# Patient Record
Sex: Male | Born: 1985 | Race: Black or African American | Hispanic: No | Marital: Single | Smoking: Current every day smoker
Health system: Southern US, Community
[De-identification: ages and names within clinical notes are randomized; demographics above are authoritative.]

---
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
## 2005-04-14 ENCOUNTER — Emergency Department (HOSPITAL_COMMUNITY): Admission: EM | Admit: 2005-04-14 | Discharge: 2005-04-14 | Payer: Self-pay | Admitting: Emergency Medicine

## 2006-04-08 ENCOUNTER — Emergency Department (HOSPITAL_COMMUNITY): Admission: EM | Admit: 2006-04-08 | Discharge: 2006-04-09 | Payer: Self-pay | Admitting: Emergency Medicine

## 2006-04-25 ENCOUNTER — Emergency Department (HOSPITAL_COMMUNITY): Admission: EM | Admit: 2006-04-25 | Discharge: 2006-04-26 | Payer: Self-pay | Admitting: Emergency Medicine

## 2006-09-15 ENCOUNTER — Emergency Department (HOSPITAL_COMMUNITY): Admission: EM | Admit: 2006-09-15 | Discharge: 2006-09-15 | Payer: Self-pay | Admitting: Emergency Medicine

## 2007-05-02 IMAGING — CR DG CHEST 2V
2 series · 2 of 2 positions shown · non-contrast
Comparison: None.

CLINICAL DATA: Cough with migraine for 2 days.   Smoker. 
 CHEST - 2 VIEW:

[w chest pa]
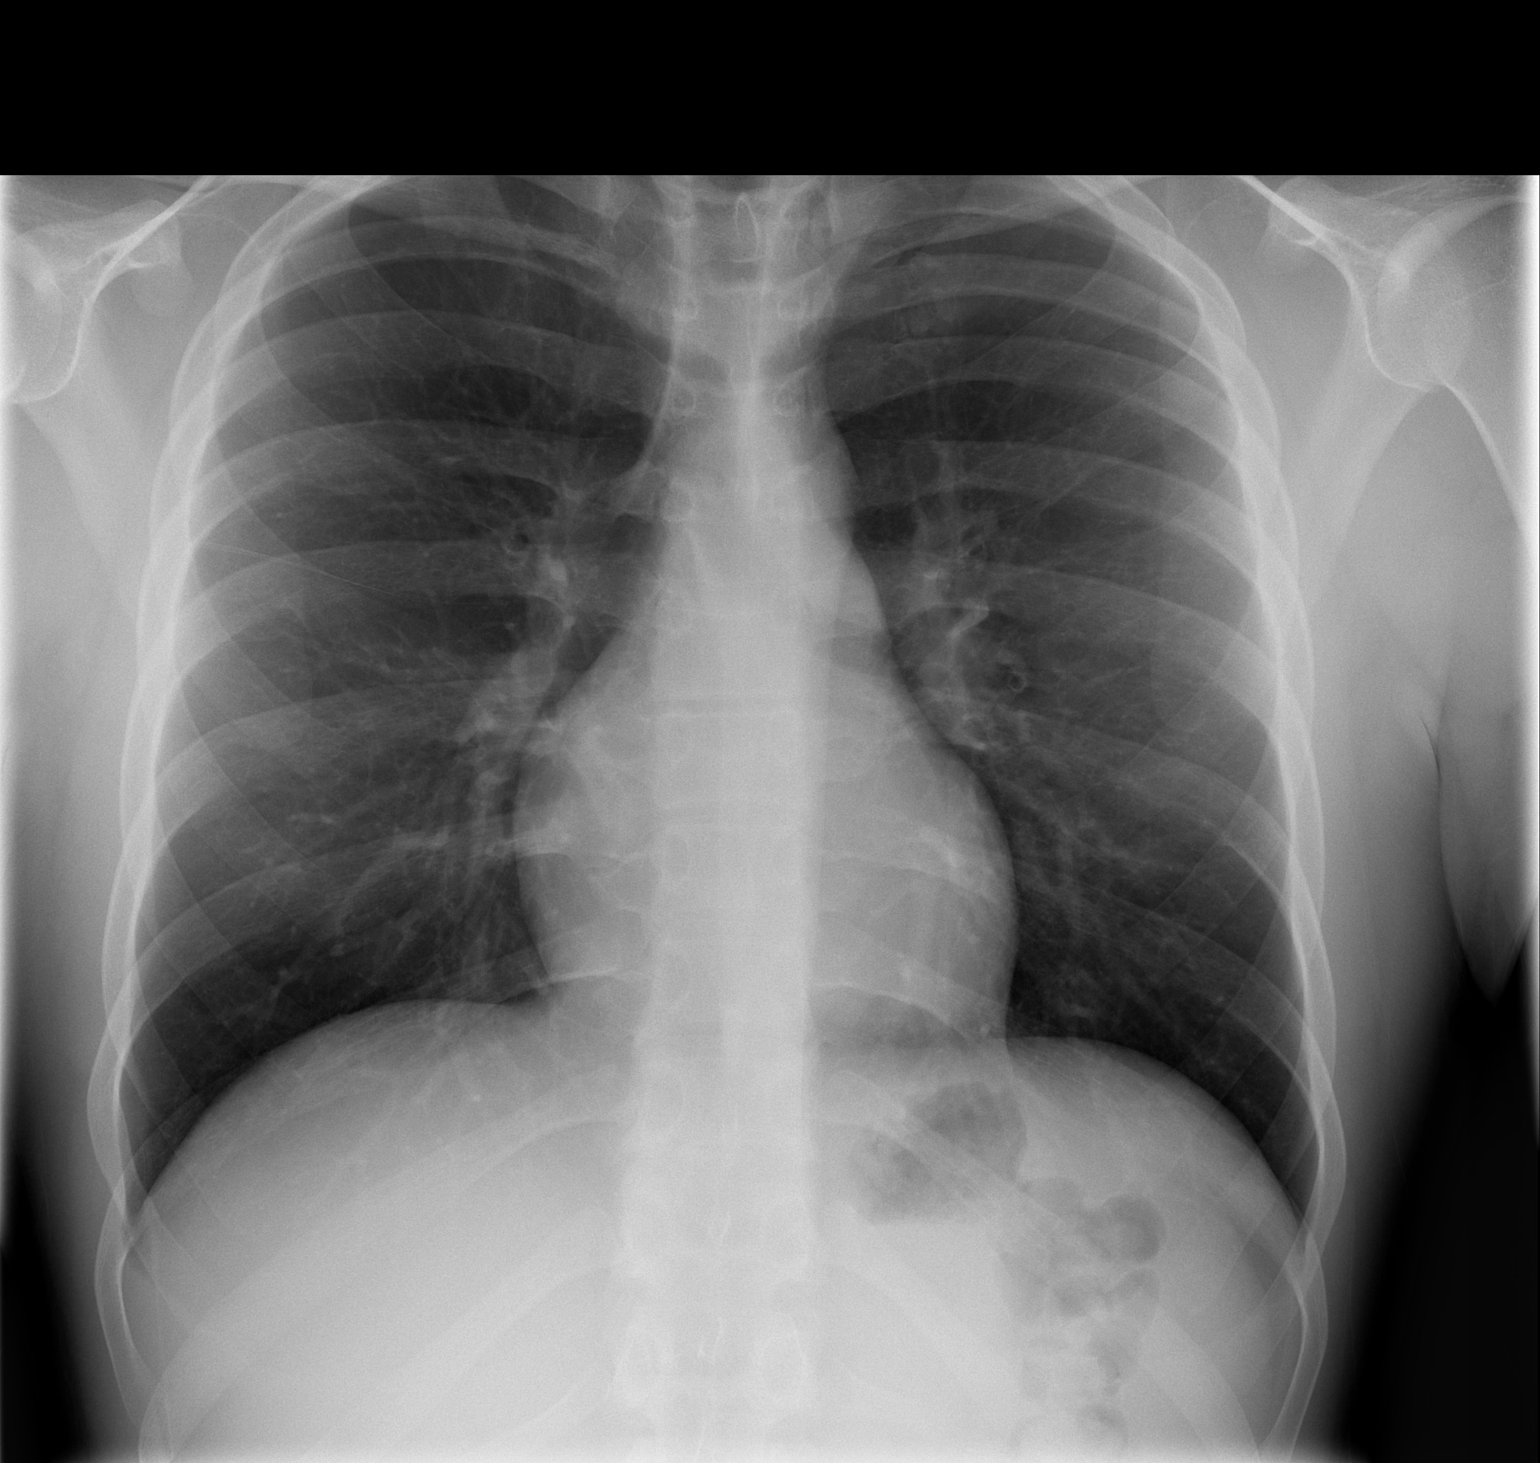

[w chest lat]
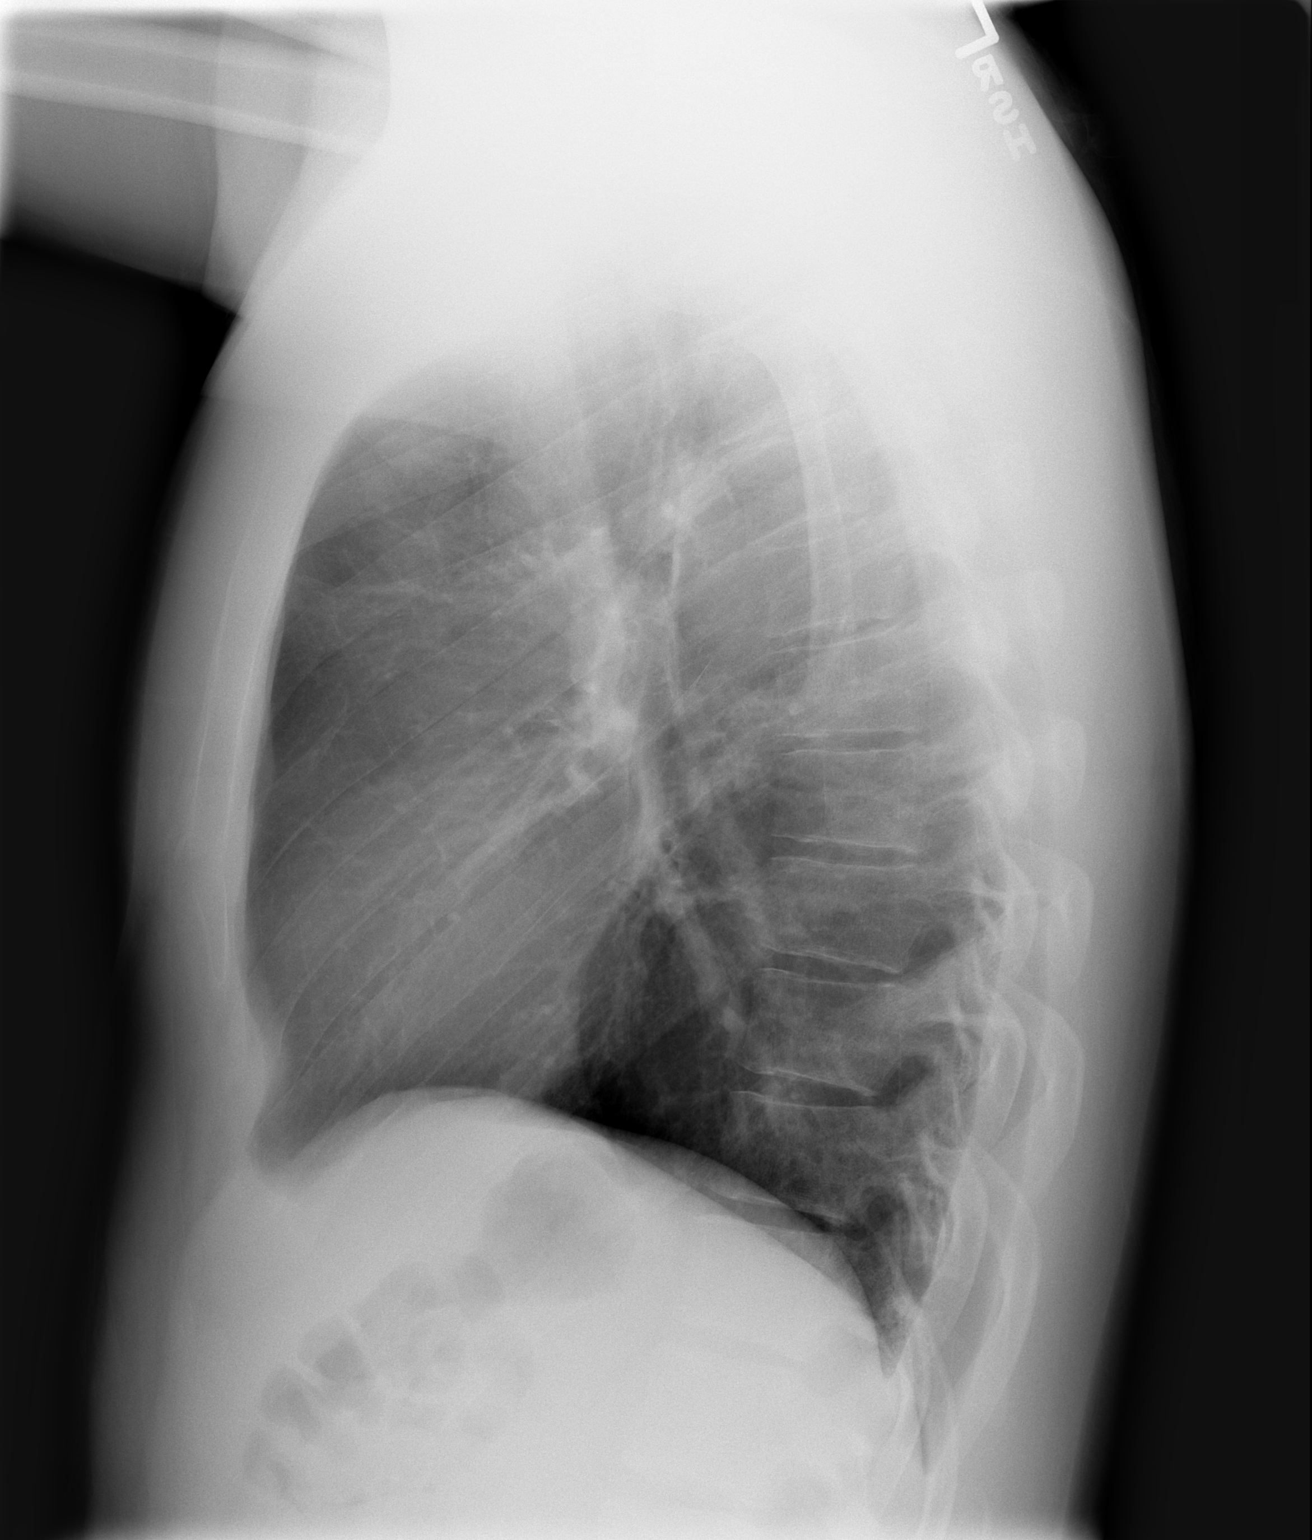

[2 of 2 positions shown; findings below may reference images not displayed]

FINDINGS: Heart size is normal. There is no pleural effusions or pneumothorax.  No focal airspace opacities are identified.
IMPRESSION: Normal chest.

## 2007-07-24 ENCOUNTER — Emergency Department (HOSPITAL_COMMUNITY): Admission: EM | Admit: 2007-07-24 | Discharge: 2007-07-24 | Payer: Self-pay | Admitting: Family Medicine

## 2007-10-14 ENCOUNTER — Emergency Department (HOSPITAL_COMMUNITY): Admission: EM | Admit: 2007-10-14 | Discharge: 2007-10-14 | Payer: Self-pay | Admitting: Family Medicine

## 2008-10-02 IMAGING — CR DG HAND COMPLETE 3+V*R*
3 series · 3 of 3 positions shown · non-contrast
Comparison: none

CLINICAL DATA: Injury to hand.  Pain in the metatarsals.
 RIGHT HAND ? 3 VIEWS:

[x hand pa right]
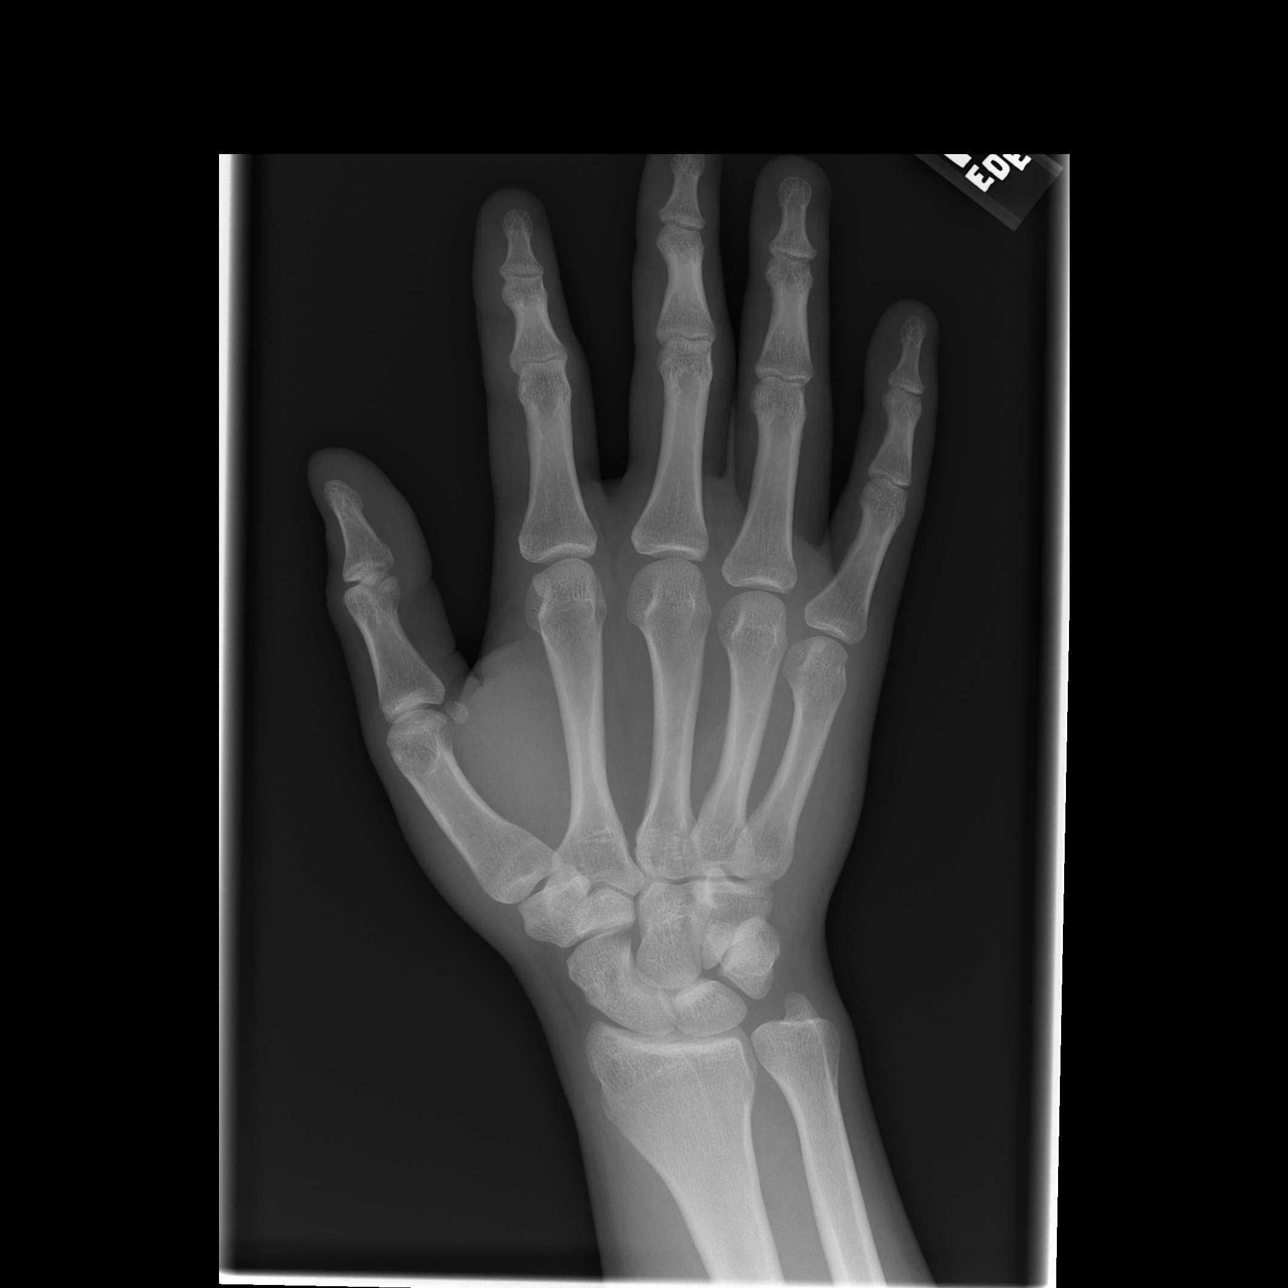

[x hand oblique right]
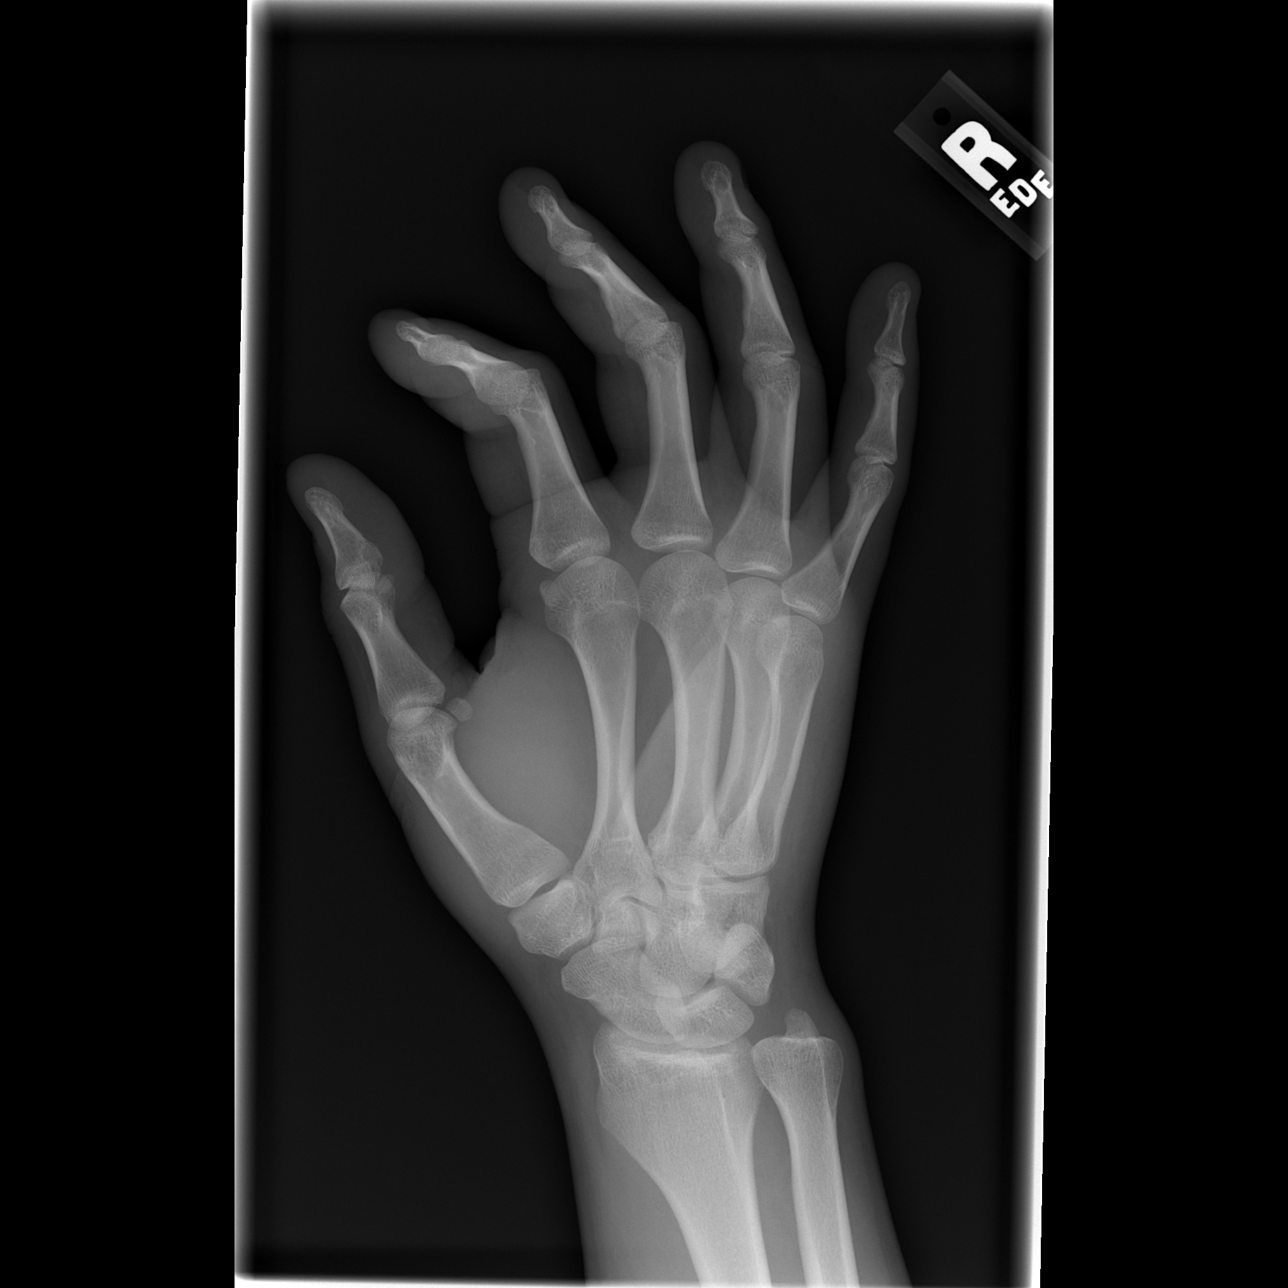

[x hand lat right]
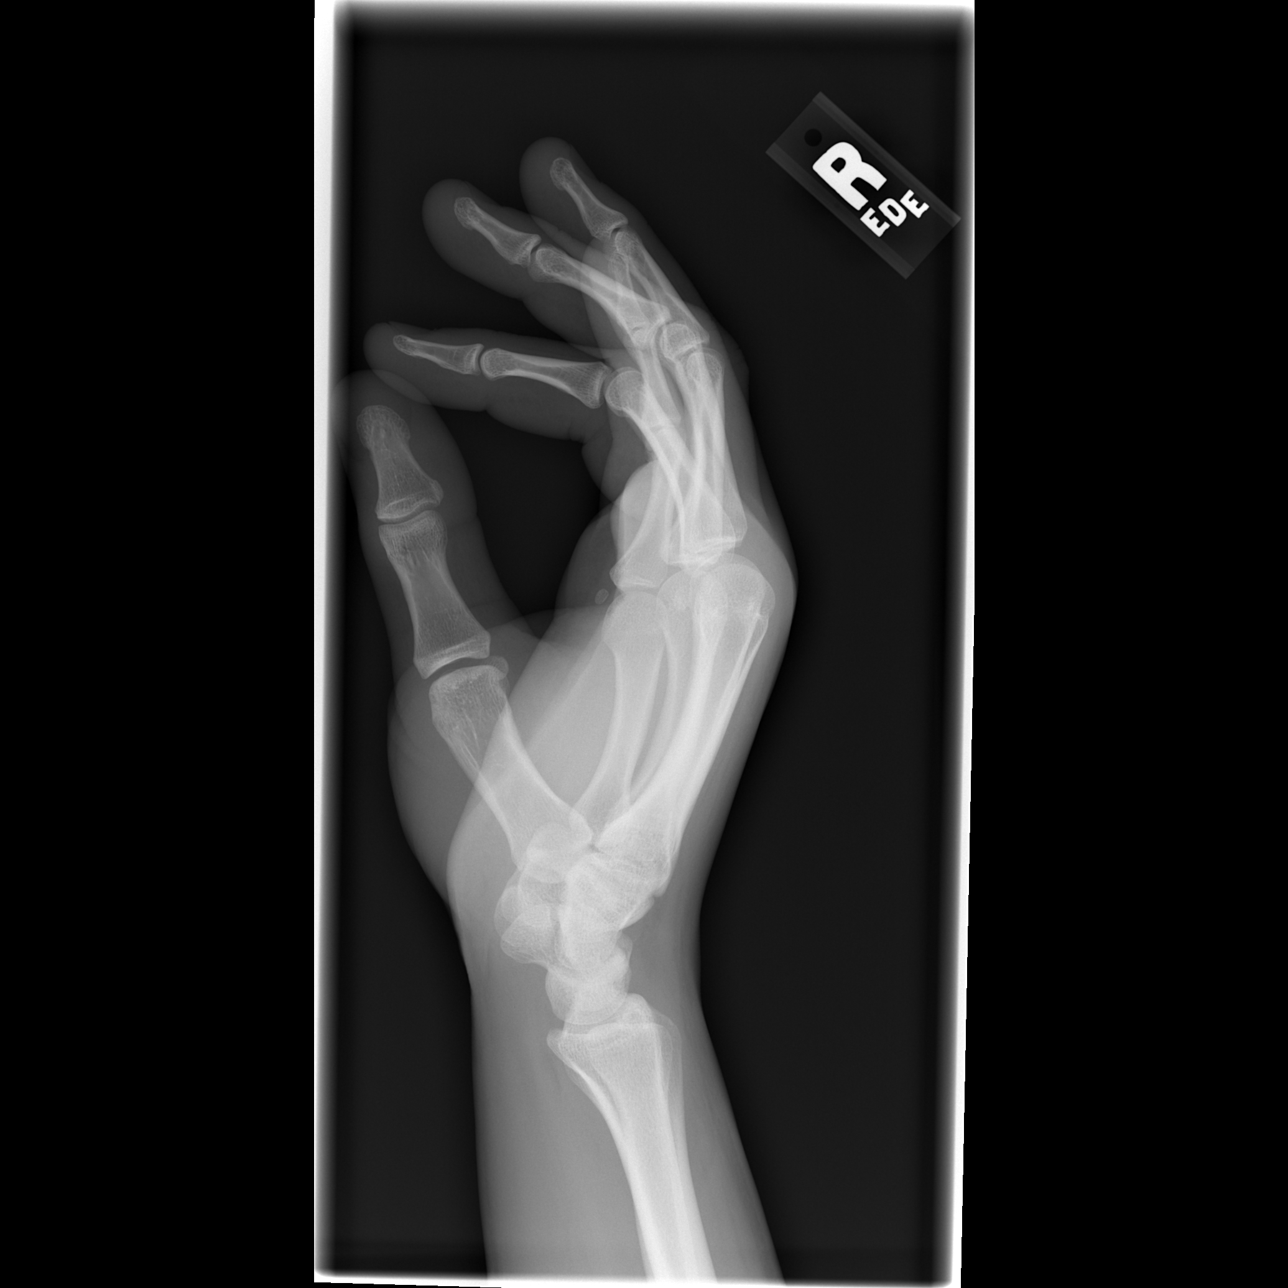

[3 of 3 positions shown; findings below may reference images not displayed]

FINDINGS: No fracture or bony displacement.
IMPRESSION: No acute right hand bony abnormality.

## 2008-10-02 IMAGING — CR DG FINGER LITTLE 2+V*L*
3 series · 3 of 3 positions shown · non-contrast
Comparison: None.

CLINICAL DATA: Crush injury to left fifth finger.
 LEFT FIFTH FINGER ? 3 VIEW:

[x finger pa left]
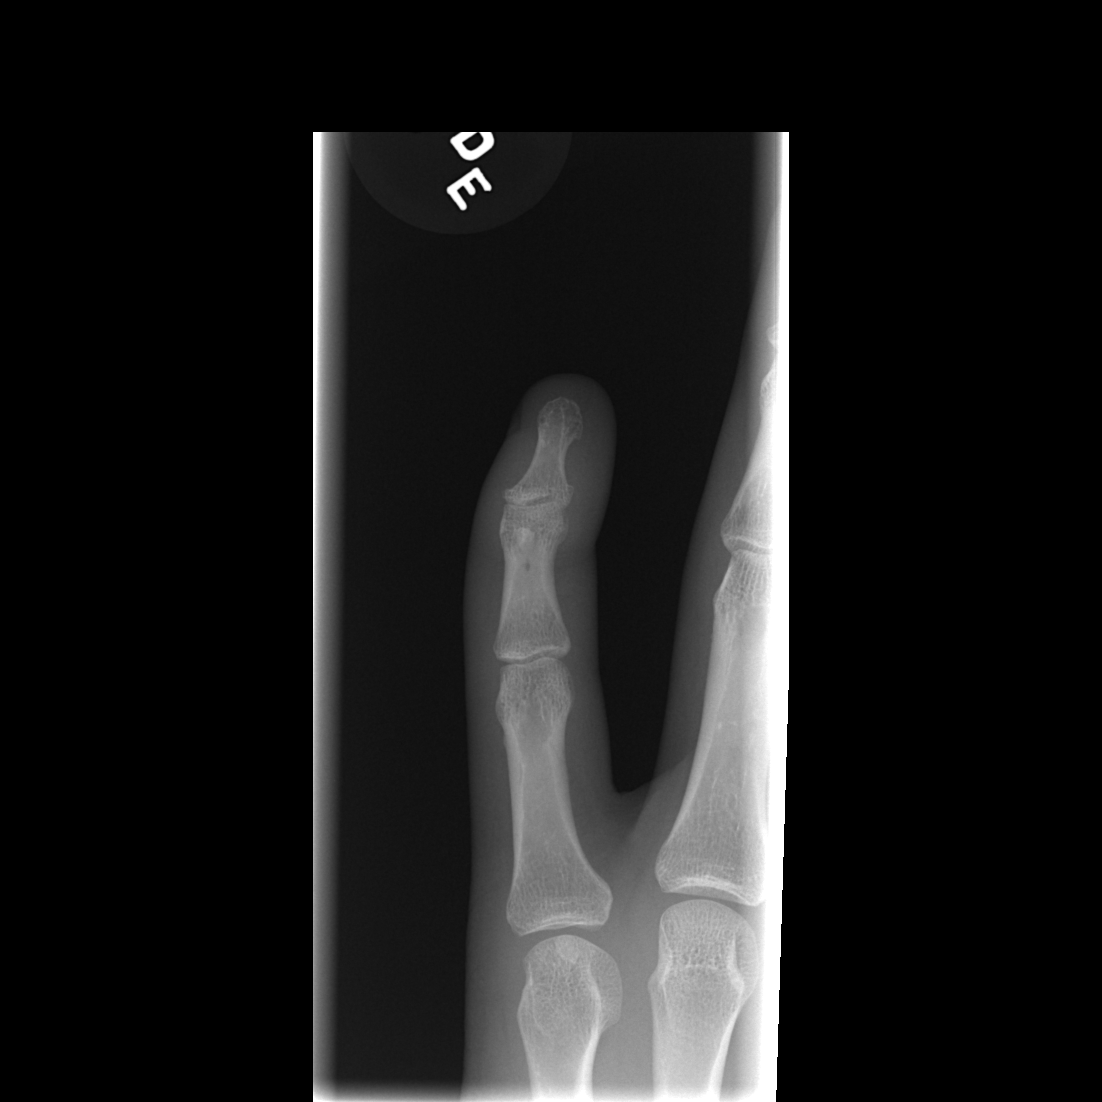

[x finger obl. left]
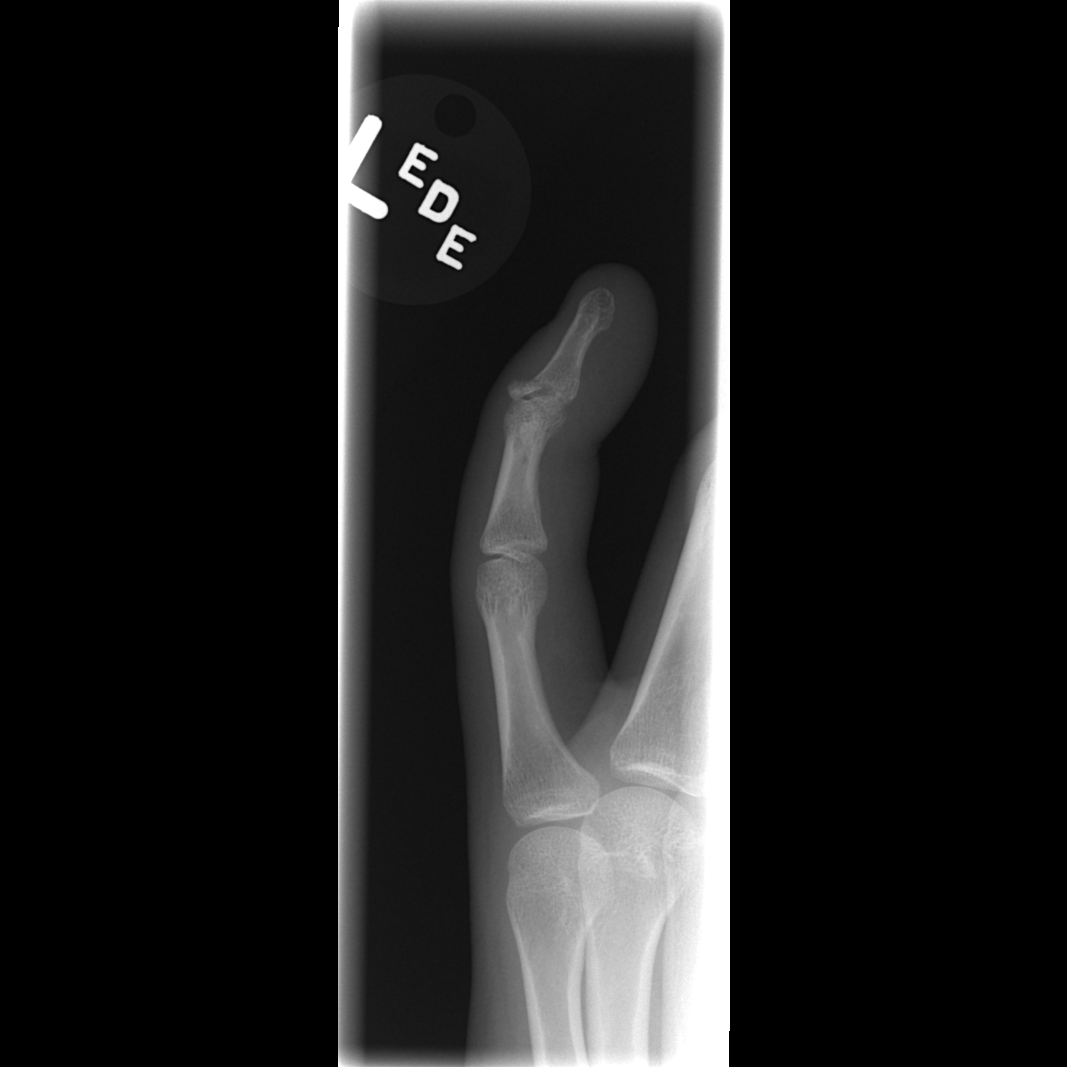

[x finger lateral left]
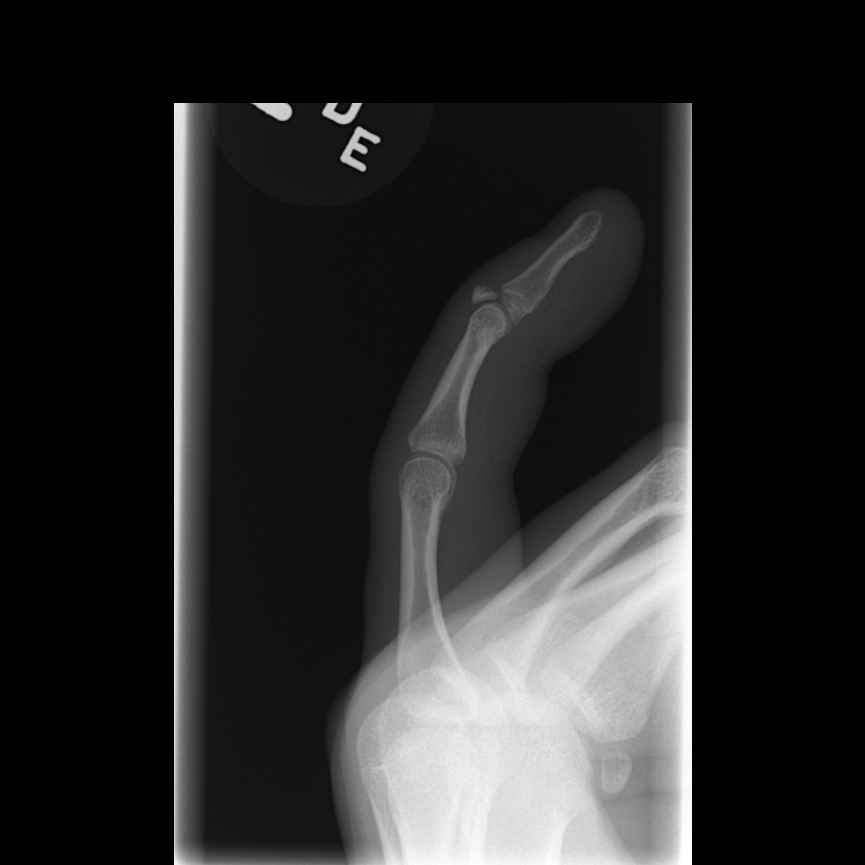

[3 of 3 positions shown; findings below may reference images not displayed]

FINDINGS: There is an avulsion fracture off the dorsal aspect of the fifth distal phalangeal base.  It is displaced. There is overlying soft tissue swelling.
IMPRESSION: Displaced avulsion fracture off the base of the fifth distal phalanx.

## 2009-02-08 ENCOUNTER — Emergency Department (HOSPITAL_COMMUNITY): Admission: EM | Admit: 2009-02-08 | Discharge: 2009-02-08 | Payer: Self-pay | Admitting: Emergency Medicine

## 2009-07-08 ENCOUNTER — Emergency Department (HOSPITAL_COMMUNITY): Admission: EM | Admit: 2009-07-08 | Discharge: 2009-07-08 | Payer: Self-pay | Admitting: Emergency Medicine

## 2009-08-10 IMAGING — CR DG CHEST 2V
2 series · 2 of 2 positions shown · non-contrast
Comparison: 04/14/05.

CLINICAL DATA: Congestion and cough. 
 CHEST - 2 VIEW:

[view not recorded (1 of 2)]
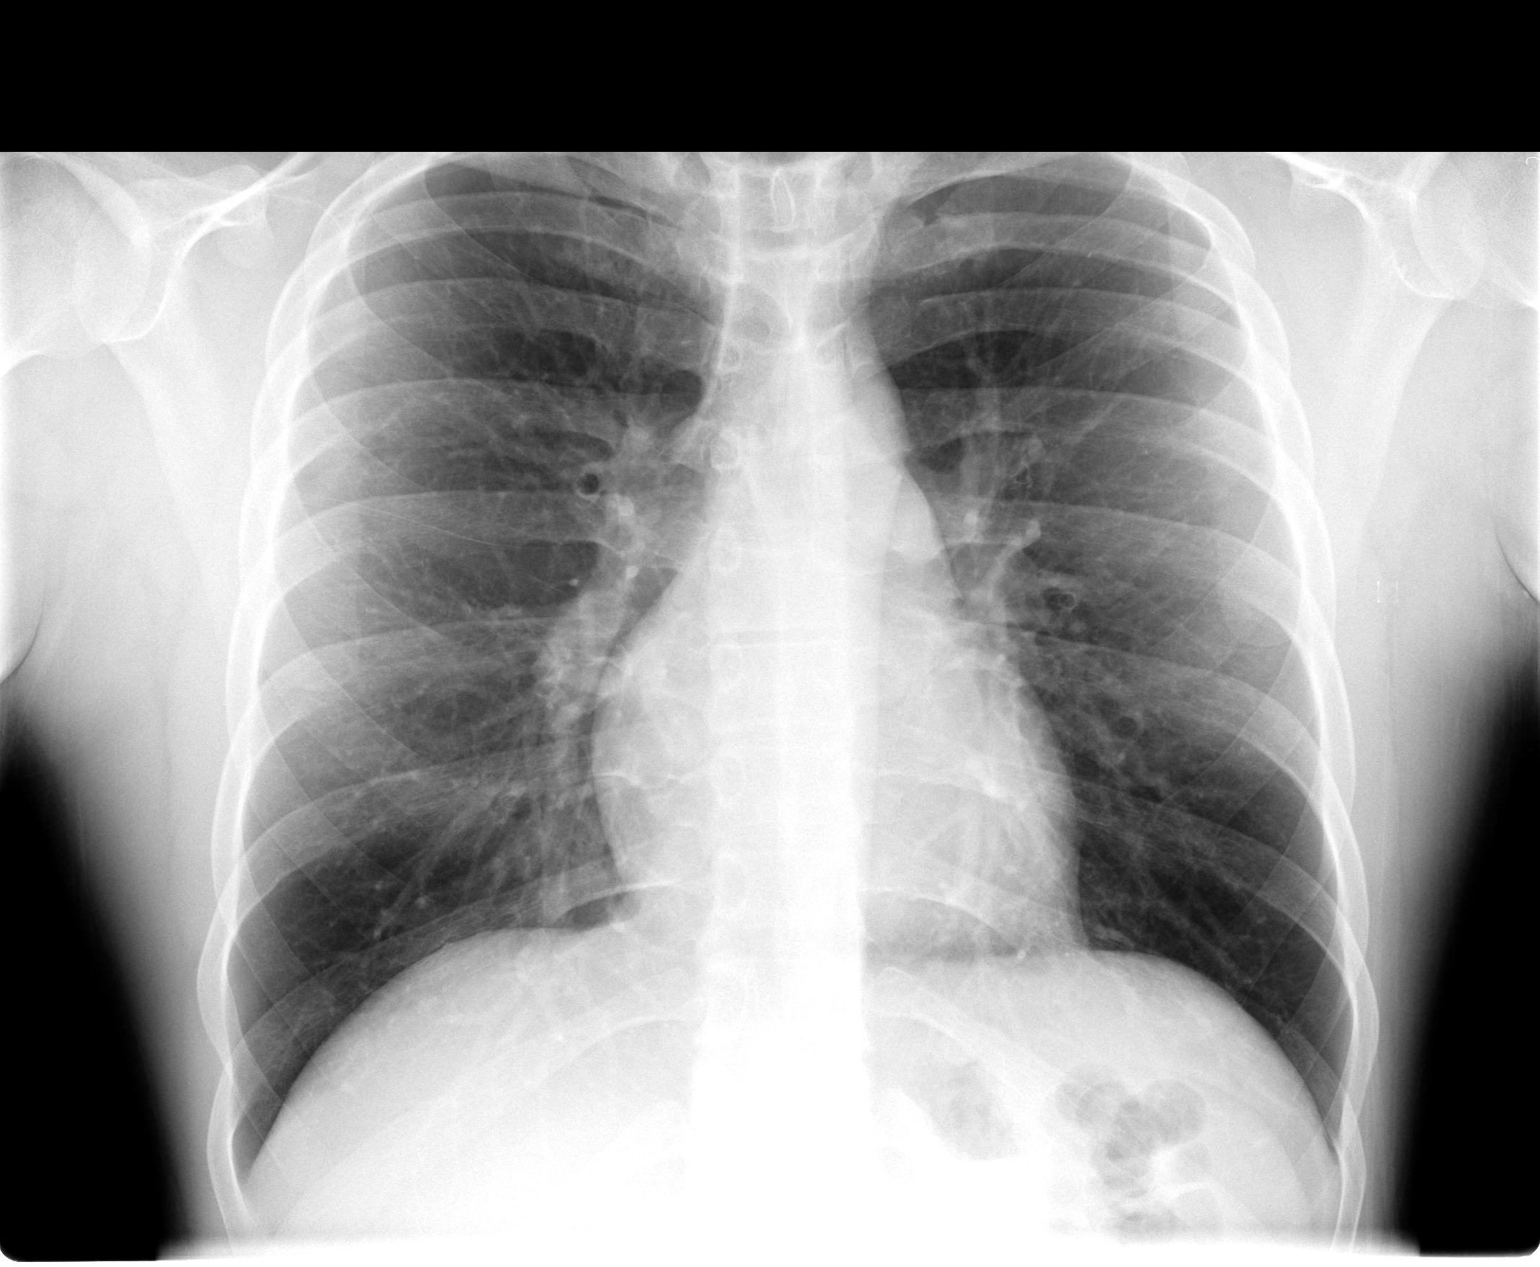

[view not recorded (2 of 2)]
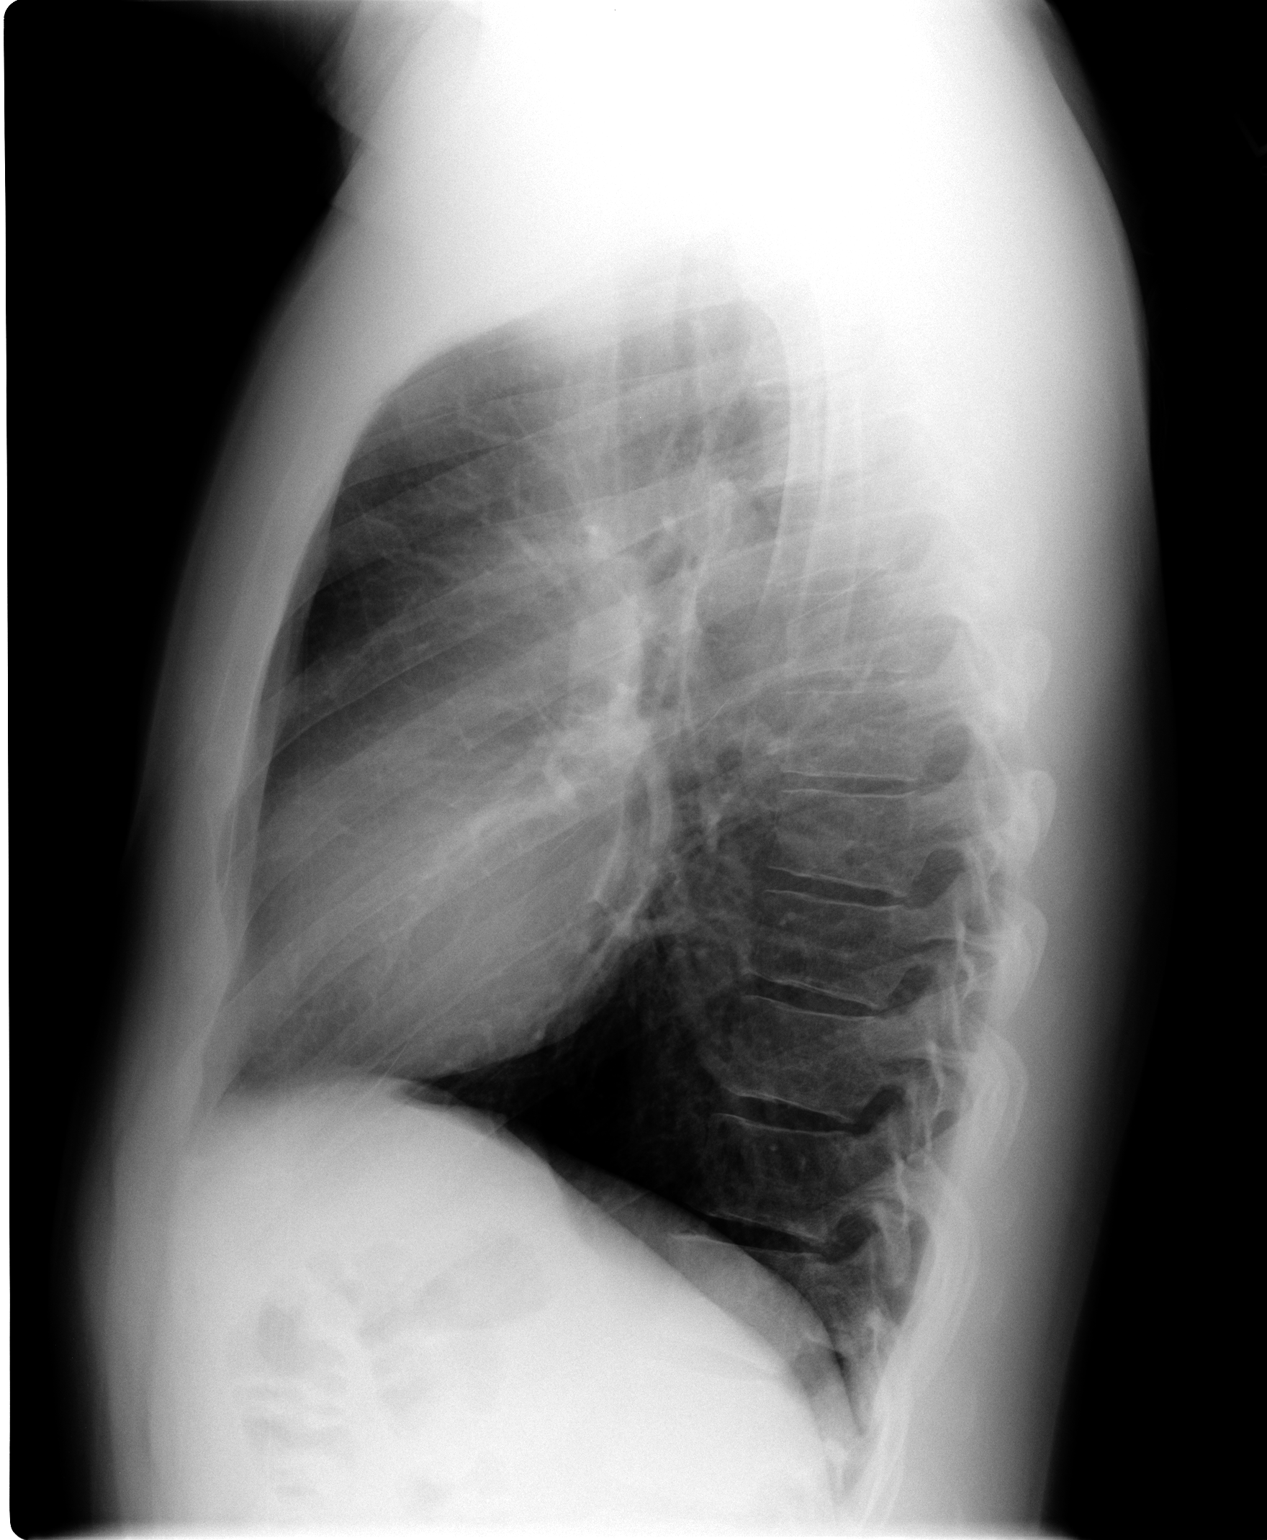

[2 of 2 positions shown; findings below may reference images not displayed]

FINDINGS: The lungs are clear.  Heart size is normal.  No pleural effusion or focal bony abnormality.
IMPRESSION: No acute disease.

## 2010-11-29 ENCOUNTER — Emergency Department (HOSPITAL_COMMUNITY)
Admission: EM | Admit: 2010-11-29 | Discharge: 2010-11-29 | Disposition: A | Discharge status: Home / Self Care | Payer: Self-pay | Attending: Emergency Medicine | Admitting: Emergency Medicine

## 2010-11-29 ENCOUNTER — Emergency Department (HOSPITAL_COMMUNITY)
Admission: EM | Admit: 2010-11-29 | Discharge: 2010-11-29 | Discharge status: Against Medical Advice | Payer: Self-pay | Attending: Emergency Medicine | Admitting: Emergency Medicine

## 2010-11-29 DIAGNOSIS — J029 Acute pharyngitis, unspecified: Secondary | ICD-10-CM | POA: Insufficient documentation

## 2010-11-29 DIAGNOSIS — R509 Fever, unspecified: Secondary | ICD-10-CM | POA: Insufficient documentation

## 2010-11-29 DIAGNOSIS — J3489 Other specified disorders of nose and nasal sinuses: Secondary | ICD-10-CM | POA: Insufficient documentation

## 2010-11-29 DIAGNOSIS — F17200 Nicotine dependence, unspecified, uncomplicated: Secondary | ICD-10-CM | POA: Insufficient documentation

## 2010-11-29 LAB — RAPID STREP SCREEN (MED CTR MEBANE ONLY): Streptococcus, Group A Screen (Direct): NEGATIVE

## 2011-01-04 LAB — DIFFERENTIAL
Basophils Absolute: 0 10*3/uL (ref 0.0–0.1)
Eosinophils Absolute: 0 10*3/uL (ref 0.0–0.7)
Lymphs Abs: 1.3 10*3/uL (ref 0.7–4.0)
Monocytes Relative: 10 % (ref 3–12)
Neutro Abs: 7.7 10*3/uL (ref 1.7–7.7)
Neutrophils Relative %: 77 % (ref 43–77)

## 2011-01-04 LAB — CBC
Hemoglobin: 16.1 g/dL (ref 13.0–17.0)
MCHC: 34.8 g/dL (ref 30.0–36.0)
RDW: 13 % (ref 11.5–15.5)

## 2011-07-06 LAB — CBC
HCT: 49.2
Hemoglobin: 17
MCV: 91.7
RBC: 5.37

## 2011-07-06 LAB — I-STAT 8, (EC8 V) (CONVERTED LAB)
Glucose, Bld: 98
Hemoglobin: 18.4 — ABNORMAL HIGH
Operator id: 116391
Potassium: 4.5
TCO2: 31
pCO2, Ven: 50.3 — ABNORMAL HIGH
pH, Ven: 7.376 — ABNORMAL HIGH

## 2011-07-06 LAB — DIFFERENTIAL
Basophils Absolute: 0.1
Lymphocytes Relative: 21
Lymphs Abs: 1.5
Neutrophils Relative %: 65

## 2011-07-26 IMAGING — CR DG ANKLE COMPLETE 3+V*R*
3 series · 3 of 3 positions shown · non-contrast
Comparison: Right foot dated 07/08/2009

CLINICAL DATA: Injury to right foot and ankle.

RIGHT ANKLE - COMPLETE 3+ VIEW

[t ankle joint ap right]
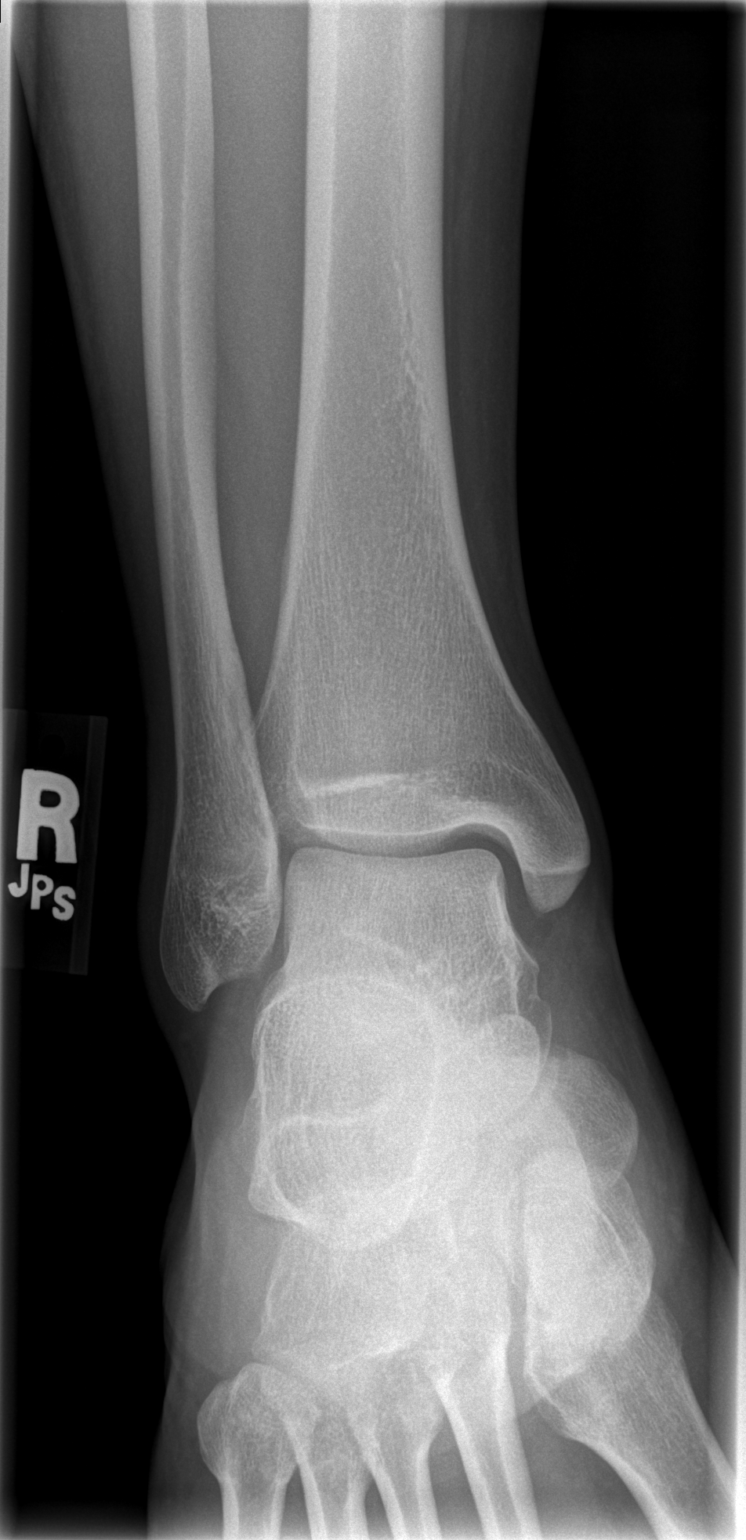

[t ankle joint oblique right]
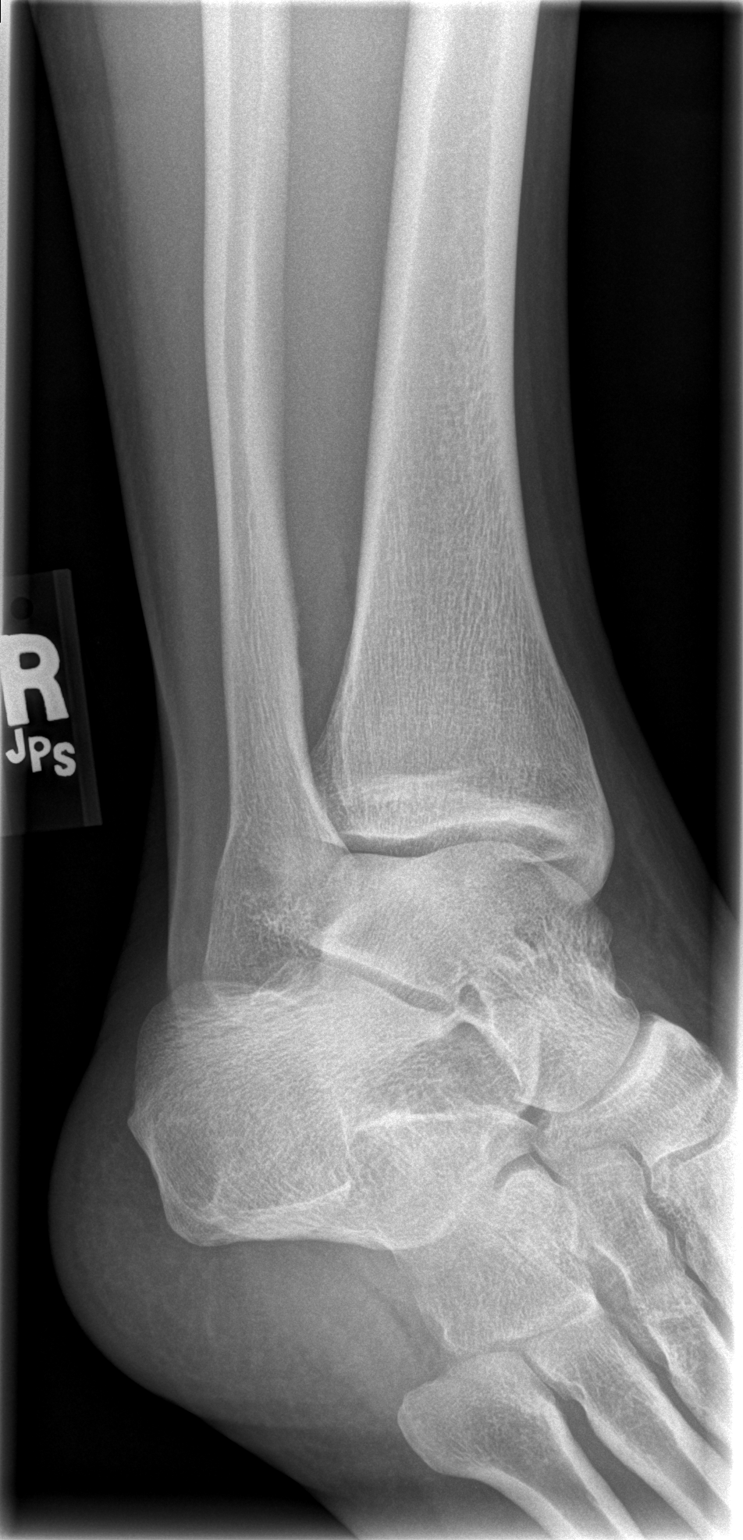

[t ankle joint lat right]
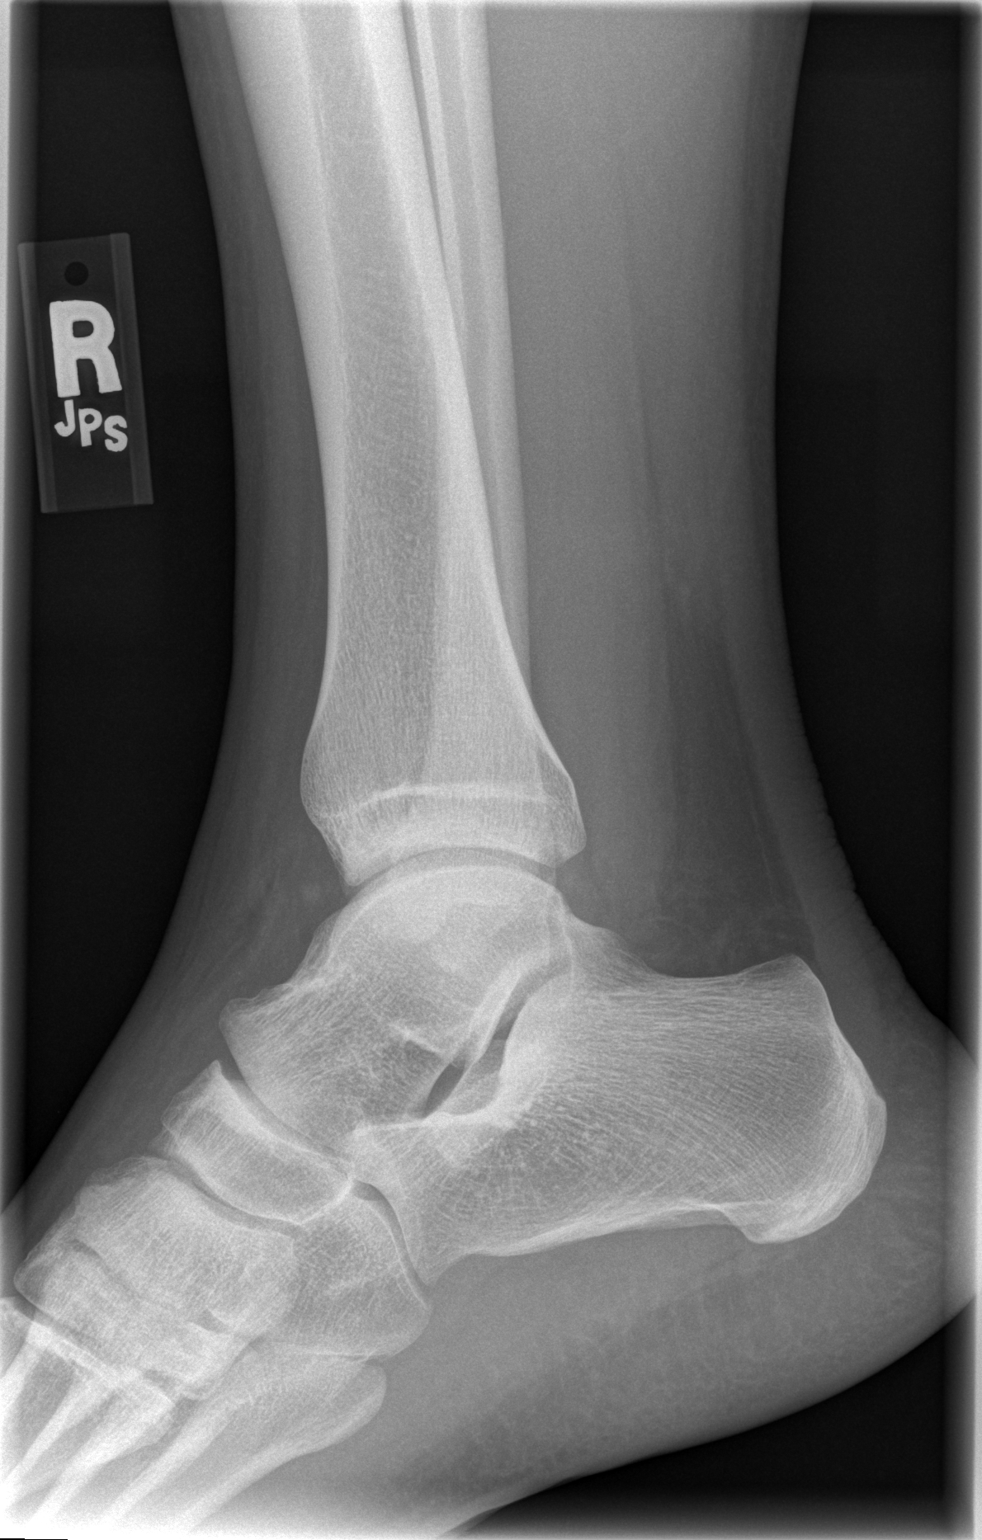

[3 of 3 positions shown; findings below may reference images not displayed]

FINDINGS: Three views of the right ankle were obtained.  There is
normal alignment.  Negative for acute fracture or dislocation.
IMPRESSION: No acute bony abnormality to the right ankle.

## 2011-07-26 IMAGING — CR DG FOOT COMPLETE 3+V*R*
3 series · 3 of 3 positions shown · non-contrast
Comparison: Right ankle dated 07/08/2009

CLINICAL DATA: Injury to the right foot and ankle.

RIGHT FOOT COMPLETE - 3+ VIEW

[t foot lat right]
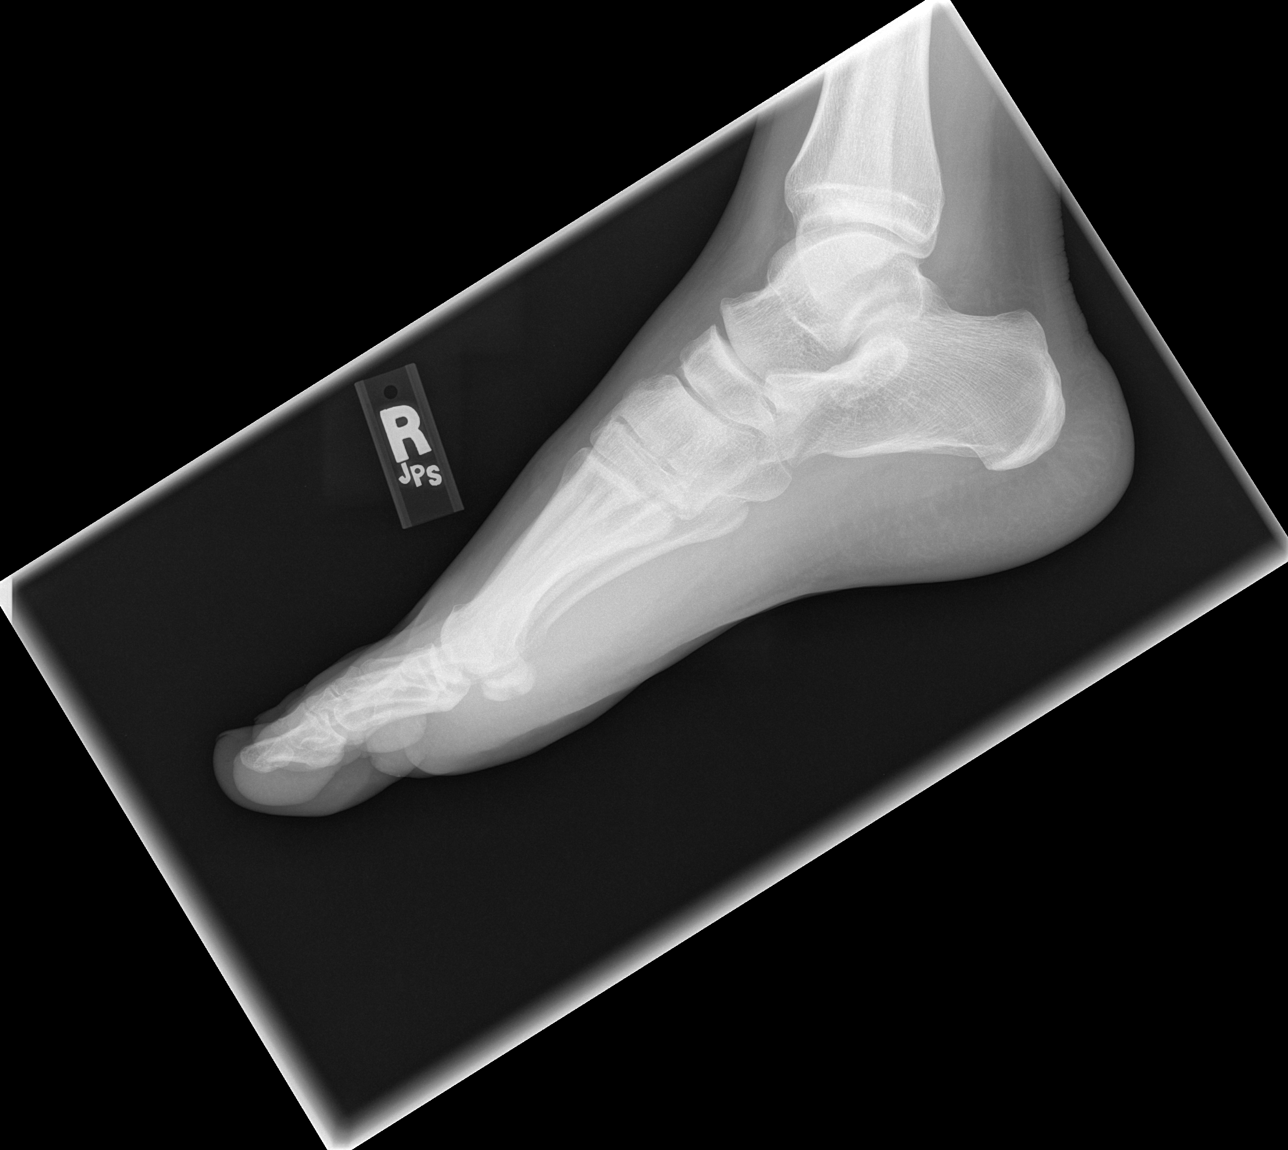

[t foot ap right]
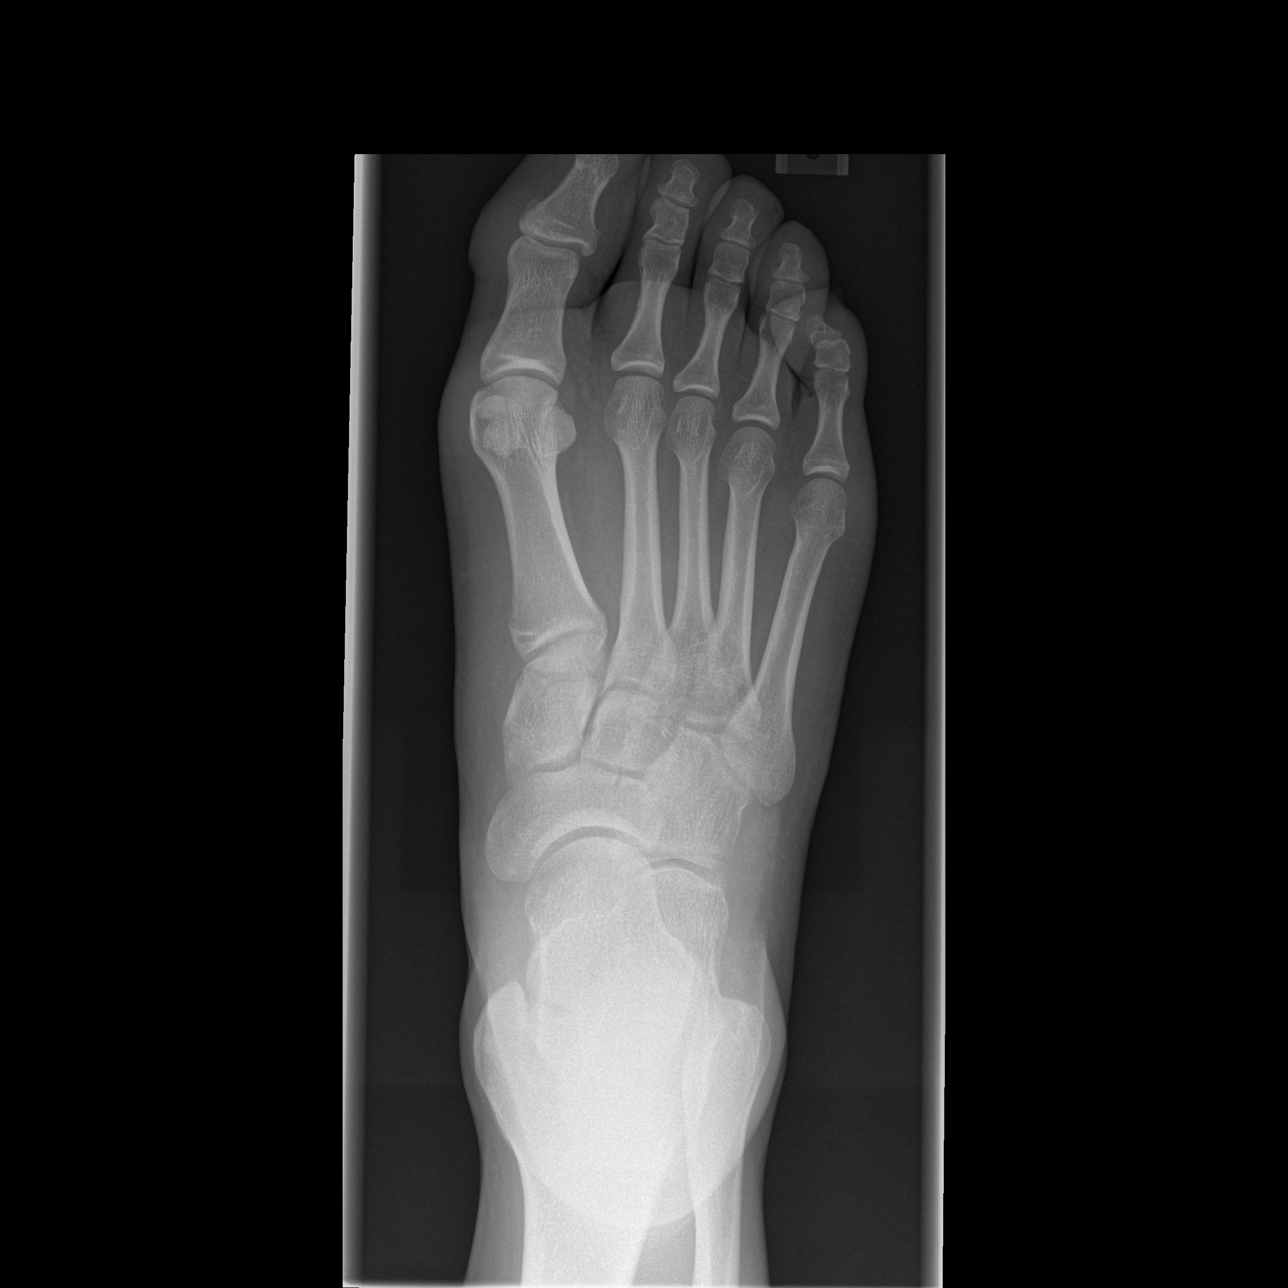

[t foot oblique right]
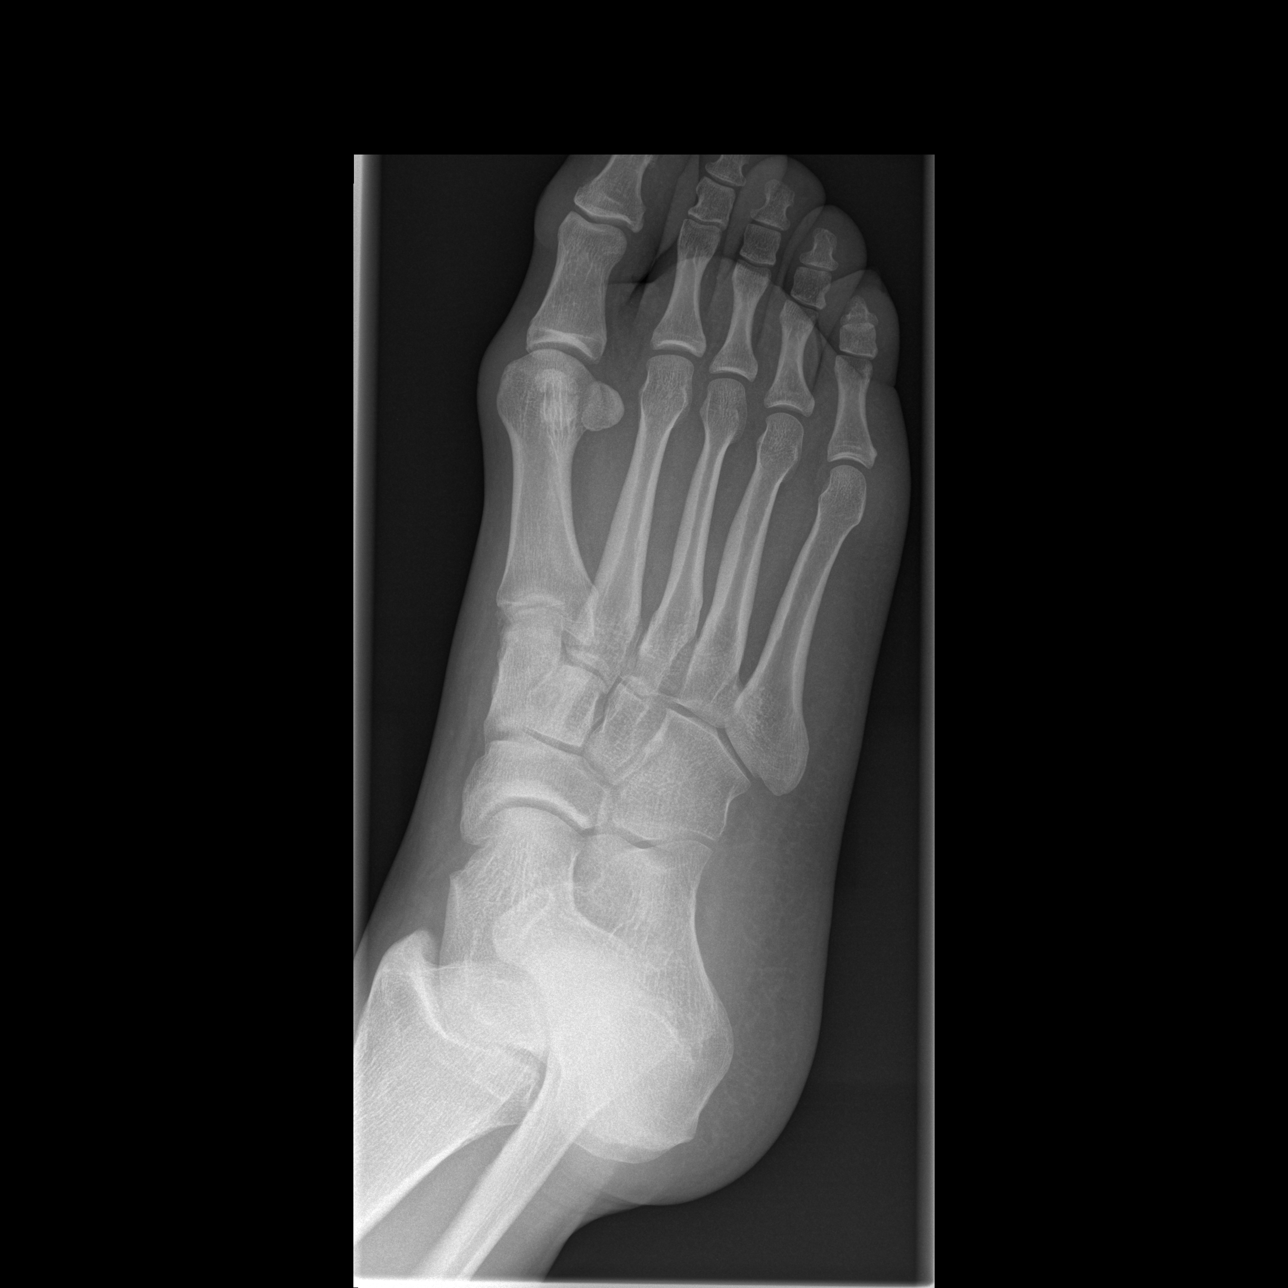

[3 of 3 positions shown; findings below may reference images not displayed]

FINDINGS: Three views of the right foot were obtained.  Alignment
of the right foot is normal.  No evidence for acute fracture or
dislocation.  There is no gross soft tissue abnormality.
IMPRESSION: No acute bone abnormality to the right foot.

## 2011-09-08 ENCOUNTER — Encounter: Payer: Self-pay | Admitting: Emergency Medicine

## 2011-09-08 ENCOUNTER — Emergency Department (HOSPITAL_COMMUNITY): Payer: Self-pay

## 2011-09-08 ENCOUNTER — Emergency Department (HOSPITAL_COMMUNITY)
Admission: EM | Admit: 2011-09-08 | Discharge: 2011-09-09 | Disposition: A | Discharge status: Home / Self Care | Payer: Self-pay | Attending: Emergency Medicine | Admitting: Emergency Medicine

## 2011-09-08 ENCOUNTER — Other Ambulatory Visit: Payer: Self-pay

## 2011-09-08 DIAGNOSIS — F1010 Alcohol abuse, uncomplicated: Secondary | ICD-10-CM | POA: Insufficient documentation

## 2011-09-08 DIAGNOSIS — R5383 Other fatigue: Secondary | ICD-10-CM | POA: Insufficient documentation

## 2011-09-08 DIAGNOSIS — R10819 Abdominal tenderness, unspecified site: Secondary | ICD-10-CM | POA: Insufficient documentation

## 2011-09-08 DIAGNOSIS — R079 Chest pain, unspecified: Secondary | ICD-10-CM | POA: Insufficient documentation

## 2011-09-08 DIAGNOSIS — F10929 Alcohol use, unspecified with intoxication, unspecified: Secondary | ICD-10-CM

## 2011-09-08 DIAGNOSIS — R5381 Other malaise: Secondary | ICD-10-CM | POA: Insufficient documentation

## 2011-09-08 MED ORDER — FAMOTIDINE IN NACL 20-0.9 MG/50ML-% IV SOLN
20.0000 mg | Freq: Once | INTRAVENOUS | Status: AC
  Administered 2011-09-08: 20 mg via INTRAVENOUS
  Filled 2011-09-08: qty 50

## 2011-09-08 MED ORDER — ONDANSETRON HCL 4 MG/2ML IJ SOLN
4.0000 mg | Freq: Once | INTRAMUSCULAR | Status: AC
  Administered 2011-09-08: 4 mg via INTRAVENOUS
  Filled 2011-09-08: qty 2

## 2011-09-08 NOTE — ED Notes (Signed)
Patient transported to X-ray 

## 2011-09-08 NOTE — ED Provider Notes (Signed)
History     CSN: [id number] Arrival date & time: 09/08/2011 11:16 PM   First MD Initiated Contact with Patient 09/08/11 2327      Chief Complaint  Patient presents with  . Alcohol Intoxication  . Chest Pain   25 year old male with no significant past medical history. States has been having pain in his epigastric region and lower midchest area for several months, but worse over the past few weeks. Patient admits to drinking approximately 6 beers this evening, but denies the use of any illicit or recreational drugs. He did vomit one time prior to arrival. Patient denies any shortness of breath. Denies any back pain or any radiation of his pain. The pain is described as sharp and states his "whole body hurts.". Denies any exertional component. Denies any family history of premature heart disease. He did not take any medications at home for the pain. Denies any recent illness. (Consider location/radiation/quality/duration/timing/severity/associated sxs/prior treatment) Patient is a 25 y.o. male presenting with intoxication and chest pain.  Alcohol Intoxication Associated symptoms include chest pain.  Chest Pain     History reviewed. No pertinent past medical history.  History reviewed. No pertinent past surgical history.  No family history on file.  History  Substance Use Topics  . Smoking status: Passive Smoker -- 1.0 packs/day  . Smokeless tobacco: Not on file  . Alcohol Use: Yes      Review of Systems  Cardiovascular: Positive for chest pain.  All other systems reviewed and are negative.    Allergies  Penicillins  Home Medications  No current outpatient prescriptions on file.  BP 128/82  Pulse 81  Temp(Src) 97.9 F (36.6 C) (Oral)  Resp 15  Wt 220 lb (99.791 kg)  SpO2 100%  Physical Exam  Nursing note and vitals reviewed. Constitutional: He is oriented to person, place, and time. He appears well-developed and well-nourished.  HENT:  Head: Normocephalic  and atraumatic.  Eyes: Conjunctivae and EOM are normal. Pupils are equal, round, and reactive to light.  Neck: Neck supple.  Cardiovascular: Normal rate, regular rhythm, normal heart sounds and intact distal pulses.  Exam reveals no gallop and no friction rub.   No murmur heard.      Mild tenderness lower sternum, reproducible   Pulmonary/Chest: Breath sounds normal. He has no wheezes. He has no rales. He exhibits no tenderness.  Abdominal: Soft. Bowel sounds are normal. He exhibits no distension. There is tenderness. There is no rebound and no guarding.       Diffuse epigastric and b/l Upper quadrant tenderness  Musculoskeletal: Normal range of motion.  Neurological: He is alert and oriented to person, place, and time. No cranial nerve deficit. Coordination normal.  Skin: Skin is warm and dry. No rash noted.  Psychiatric: He has a normal mood and affect.    ED Course  Procedures (including critical care time)   Labs Reviewed  BASIC METABOLIC PANEL  I-STAT TROPONIN I  ETHANOL   No results found.   No diagnosis found.    MDM   Date: 09/08/2011  Rate: 83  Rhythm: normal sinus rhythm  QRS Axis: normal  Intervals: normal  ST/T Wave abnormalities: early repolarization  Conduction Disutrbances:none  Narrative Interpretation:   Old EKG Reviewed: unchanged Compared to Jul 24, 2007          12:50 AM  Results for orders placed during the hospital encounter of 09/08/11  BASIC METABOLIC PANEL      Component Value Range   Sodium  134 (*) 135 - 145 (mEq/L)   Potassium 3.5  3.5 - 5.1 (mEq/L)   Chloride 98  96 - 112 (mEq/L)   CO2 22  19 - 32 (mEq/L)   Glucose, Bld 97  70 - 99 (mg/dL)   BUN 11  6 - 23 (mg/dL)   Creatinine, Ser 2.84  0.50 - 1.35 (mg/dL)   Calcium 9.3  8.4 - 13.2 (mg/dL)   GFR calc non Af Amer >90  >90 (mL/min)   GFR calc Af Amer >90  >90 (mL/min)  ETHANOL      Component Value Range   Alcohol, Ethyl (B) 213 (*) 0 - 11 (mg/dL)  HEPATIC FUNCTION PANEL        Component Value Range   Total Protein 8.0  6.0 - 8.3 (g/dL)   Albumin 3.9  3.5 - 5.2 (g/dL)   AST 25  0 - 37 (U/L)   ALT 30  0 - 53 (U/L)   Alkaline Phosphatase 66  39 - 117 (U/L)   Total Bilirubin 0.4  0.3 - 1.2 (mg/dL)   Bilirubin, Direct <4.4  0.0 - 0.3 (mg/dL)   Indirect Bilirubin NOT CALCULATED  0.3 - 0.9 (mg/dL)  LIPASE, BLOOD      Component Value Range   Lipase 36  11 - 59 (U/L)  URINE RAPID DRUG SCREEN (HOSP PERFORMED)      Component Value Range   Opiates NONE DETECTED  NONE DETECTED    Cocaine NONE DETECTED  NONE DETECTED    Benzodiazepines NONE DETECTED  NONE DETECTED    Amphetamines NONE DETECTED  NONE DETECTED    Tetrahydrocannabinol NONE DETECTED  NONE DETECTED    Barbiturates NONE DETECTED  NONE DETECTED   POCT I-STAT TROPONIN I      Component Value Range   Troponin i, poc 0.01  0.00 - 0.08 (ng/mL)   Comment 3            Dg Chest 2 View  09/09/2011  *RADIOLOGY REPORT*  Clinical Data: Chest pain and weakness.  CHEST - 2 VIEW  Comparison: 07/24/2007  Findings: The lungs are clear without focal infiltrate, edema, pneumothorax or pleural effusion. The cardiopericardial silhouette is within normal limits for size. Imaged bony structures of the thorax are intact.  IMPRESSION: Stable.  No acute findings.  Original Report Authenticated By: ERIC A. MANSELL, M.D.    Patient is resting comfortably, sleeping. Initial lab studies unremarkable other than a blood alcohol of 213. Will repeat a three-hour troponin, and patient can likely go home. I feel the pain was likely due to gastritis or chronic ethanol abuse.  1:49 AM Patient is leaving AMA. Please see discharge papers for nodes including all risks, benefits, and alternatives discussed. Patient appears to be awake, and expresses adequate benefit medical decision-making capacity.  Paco Cislo A. Patrica Duel, MD 09/09/11 [phone]

## 2011-09-08 NOTE — ED Notes (Signed)
ZOX:WRUE<AV> Expected date:09/08/11<BR> Expected time:10:56 PM<BR> Means of arrival:Ambulance<BR> Comments:<BR> EMS 10 GC, etoh and unknown pills [employer] enroute with unit

## 2011-09-08 NOTE — ED Notes (Signed)
Pt presents via EMS, +ETOH, c/o chest pain, pt laert, smells ETOH, Girlfriend bedside, + emesis pta, poor hx per girlfriend, pupils pinpoint, resp even unlabored, skin bwd

## 2011-09-09 LAB — BASIC METABOLIC PANEL
BUN: 11 mg/dL (ref 6–23)
Calcium: 9.3 mg/dL (ref 8.4–10.5)
Creatinine, Ser: 0.88 mg/dL (ref 0.50–1.35)
GFR calc Af Amer: >90 mL/min (ref >90)
GFR calc non Af Amer: >90 mL/min (ref >90)
Glucose, Bld: 97 mg/dL (ref 70–99)
Potassium: 3.5 mEq/L (ref 3.5–5.1)

## 2011-09-09 LAB — RAPID URINE DRUG SCREEN, HOSP PERFORMED
Amphetamines: NOT DETECTED
Benzodiazepines: NOT DETECTED
Tetrahydrocannabinol: NOT DETECTED

## 2011-09-09 LAB — POCT I-STAT TROPONIN I: Troponin i, poc: 0 ng/mL (ref 0.00–0.08)

## 2011-09-09 LAB — ETHANOL: Alcohol, Ethyl (B): 213 mg/dL — ABNORMAL HIGH (ref 0–11)

## 2011-09-09 LAB — LIPASE, BLOOD: Lipase: 36 U/L (ref 11–59)

## 2011-09-09 LAB — HEPATIC FUNCTION PANEL
ALT: 30 U/L (ref 0–53)
Alkaline Phosphatase: 66 U/L (ref 39–117)

## 2011-09-09 NOTE — ED Notes (Signed)
Pt remains on unit, family to desk, pt wanting to leave AMA, charge RN bedside to speak with pt, ER ALP aware, cont to monitor

## 2011-09-09 NOTE — ED Notes (Signed)
Pt returns to room, urine sample obtained, sent to lab

## 2011-12-08 ENCOUNTER — Emergency Department (HOSPITAL_COMMUNITY)
Admission: EM | Admit: 2011-12-08 | Discharge: 2011-12-08 | Disposition: A | Discharge status: Home / Self Care | Payer: Self-pay | Attending: Emergency Medicine | Admitting: Emergency Medicine

## 2011-12-08 ENCOUNTER — Encounter (HOSPITAL_COMMUNITY): Payer: Self-pay | Admitting: *Deleted

## 2011-12-08 DIAGNOSIS — Z880 Allergy status to penicillin: Secondary | ICD-10-CM | POA: Insufficient documentation

## 2011-12-08 DIAGNOSIS — F17200 Nicotine dependence, unspecified, uncomplicated: Secondary | ICD-10-CM | POA: Insufficient documentation

## 2011-12-08 DIAGNOSIS — R1013 Epigastric pain: Secondary | ICD-10-CM | POA: Insufficient documentation

## 2011-12-08 LAB — COMPREHENSIVE METABOLIC PANEL
Albumin: 3.6 g/dL (ref 3.5–5.2)
BUN: 11 mg/dL (ref 6–23)
Chloride: 103 mEq/L (ref 96–112)
Creatinine, Ser: 1.07 mg/dL (ref 0.50–1.35)
GFR calc Af Amer: >90 mL/min (ref >90)
GFR calc non Af Amer: >90 mL/min (ref >90)
Total Bilirubin: 0.4 mg/dL (ref 0.3–1.2)

## 2011-12-08 LAB — LIPASE, BLOOD: Lipase: 20 U/L (ref 11–59)

## 2011-12-08 MED ORDER — FAMOTIDINE 20 MG PO TABS: 20.0000 mg | ORAL_TABLET | Freq: Two times a day (BID) | ORAL | Status: DC

## 2011-12-08 NOTE — Discharge Instructions (Signed)
Abdominal Pain (Nonspecific)  Your exam might not show the exact reason you have abdominal pain. Since there are many different causes of abdominal pain, another checkup and more tests may be needed. It is very important to follow up for lasting (persistent) or worsening symptoms. A possible cause of abdominal pain in any person who still has his or her appendix is acute appendicitis. Appendicitis is often hard to diagnose. Normal blood tests, urine tests, ultrasound, and CT scans do not completely rule out early appendicitis or other causes of abdominal pain. Sometimes, only the changes that happen over time will allow appendicitis and other causes of abdominal pain to be determined. Other potential problems that may require surgery may also take time to become more apparent. Because of this, it is important that you follow all of the instructions below.  HOME CARE INSTRUCTIONS    Rest as much as possible.   Do not eat solid food until your pain is gone.   While adults or children have pain: A diet of water, weak decaffeinated tea, broth or bouillon, gelatin, oral rehydration solutions (ORS), frozen ice pops, or ice chips may be helpful.   When pain is gone in adults or children: Start a light diet (dry toast, crackers, applesauce, or white rice). Increase the diet slowly as long as it does not bother you. Eat no dairy products (including cheese and eggs) and no spicy, fatty, fried, or high-fiber foods.   Use no alcohol, caffeine, or cigarettes.   Take your regular medicines unless your caregiver told you not to.   Take any prescribed medicine as directed.   Only take over-the-counter or prescription medicines for pain, discomfort, or fever as directed by your caregiver. Do not give aspirin to children.  If your caregiver has given you a follow-up appointment, it is very important to keep that appointment. Not keeping the appointment could result in a permanent injury and/or lasting (chronic) pain and/or  disability. If there is any problem keeping the appointment, you must call to reschedule.   SEEK IMMEDIATE MEDICAL CARE IF:    Your pain is not gone in 24 hours.   Your pain becomes worse, changes location, or feels different.   You or your child has an oral temperature above 102 F (38.9 C), not controlled by medicine.   Your baby is older than 3 months with a rectal temperature of 102 F (38.9 C) or higher.   Your baby is 3 months old or younger with a rectal temperature of 100.4 F (38 C) or higher.   You have shaking chills.   You keep throwing up (vomiting) or cannot drink liquids.   There is blood in your vomit or you see blood in your bowel movements.   Your bowel movements become dark or black.   You have frequent bowel movements.   Your bowel movements stop (become blocked) or you cannot pass gas.   You have bloody, frequent, or painful urination.   You have yellow discoloration in the skin or whites of the eyes.   Your stomach becomes bloated or bigger.   You have dizziness or fainting.   You have chest or back pain.  MAKE SURE YOU:    Understand these instructions.   Will watch your condition.   Will get help right away if you are not doing well or get worse.  Document Released: 09/12/2005 Document Revised: 09/01/2011 Document Reviewed: 08/10/2009  [employer] Patient Information 2012 [employer].

## 2011-12-08 NOTE — ED Provider Notes (Signed)
History     CSN: [id number]  Arrival date & time 12/08/11  1544   First MD Initiated Contact with Patient 12/08/11 1654      Chief Complaint  Patient presents with  . Abdominal Pain    (Consider location/radiation/quality/duration/timing/severity/associated sxs/prior treatment) Patient is a 26 y.o. male presenting with abdominal pain. The history is provided by the patient. No language interpreter was used.  Abdominal Pain The primary symptoms of the illness include abdominal pain. The primary symptoms of the illness do not include vomiting or diarrhea. The current episode started more than 2 days ago. The onset of the illness was gradual. The problem has been gradually worsening.  The illness is associated with alcohol use. The patient has not had a change in bowel habit. Symptoms associated with the illness do not include chills, anorexia, diaphoresis or constipation.    History reviewed. No pertinent past medical history.  History reviewed. No pertinent past surgical history.  History reviewed. No pertinent family history.  History  Substance Use Topics  . Smoking status: Passive Smoker -- 1.0 packs/day  . Smokeless tobacco: Not on file  . Alcohol Use: Yes      Review of Systems  Constitutional: Negative for chills and diaphoresis.  Gastrointestinal: Positive for abdominal pain. Negative for vomiting, diarrhea, constipation and anorexia.  All other systems reviewed and are negative.    Allergies  Penicillins  Home Medications   Current Outpatient Rx  Name Route Sig Dispense Refill  . IBUPROFEN 200 MG PO TABS Oral Take 800 mg by mouth every 6 (six) hours as needed. For pain    . FAMOTIDINE 20 MG PO TABS Oral Take 1 tablet (20 mg total) by mouth 2 (two) times daily. 30 tablet 0    BP 115/69  Pulse 71  Temp(Src) 98 F (36.7 C) (Oral)  Resp 18  SpO2 99%  Physical Exam  Nursing note and vitals reviewed. Constitutional: He is oriented to person, place, and  time. He appears well-developed and well-nourished.  HENT:  Head: Normocephalic.  Eyes: Conjunctivae are normal. Pupils are equal, round, and reactive to light.  Neck: Normal range of motion. Neck supple.  Cardiovascular: Normal rate, regular rhythm, normal heart sounds and intact distal pulses.   Pulmonary/Chest: Effort normal and breath sounds normal.  Abdominal: Soft. He exhibits no distension and no mass. There is tenderness. There is no rebound and no guarding.  Musculoskeletal: Normal range of motion.  Lymphadenopathy:    He has no cervical adenopathy.  Neurological: He is alert and oriented to person, place, and time.  Skin: Skin is warm and dry.  Psychiatric: He has a normal mood and affect. His behavior is normal. Judgment and thought content normal.  Mild epigastric tenderness.  ED Course  Procedures (including critical care time)   Labs Reviewed  COMPREHENSIVE METABOLIC PANEL  LIPASE, BLOOD   No results found.   1. Epigastric abdominal pain     Patient admits to frequent and sometimes heavy alcohol use.  Patient denies blood in stool, vomiting blood.  Will check LFT's.  Suspect early PUD  MDM          Jimmye Norman, NP 12/08/11 2337

## 2011-12-08 NOTE — ED Notes (Signed)
Pt reports abdominal pain x 1 week. Denies n/v/d. Pt points to upper abdomen, states when he lays flat discomfort goes up into his chest.

## 2011-12-08 NOTE — ED Notes (Signed)
PATIENT STATES THAT HE HAD ONSET UPPER ABDOMINAL "TIGHNESS", "PRESSURE" LAST Wednesday. STATES HE HAS NOTICED INCREASED BELCHING. STATES PAIN NOT ASSOCIATED WITH MEALS. DENIES FEVER,N/V/D. STATES PAIN IS INTERMITTENT AND WHEN HE LAYS IN "CERTAIN WAYS  PRESSURE FEELS LIKE IT GOES UP INTO MY CHEST". DENIES SOB. DENIES CP. WARM AND DRY. MUCOUS MEMBRANES PINK AND MOIST. PT STATES HE DID HAVE PAIN THIS MORNING AND WAS UNABLE TO GO TO WORK.

## 2011-12-12 NOTE — ED Provider Notes (Signed)
Medical screening examination/treatment/procedure(s) were performed by non-physician practitioner and as supervising physician I was immediately available for consultation/collaboration.  Doug Sou, MD 12/12/11 [phone]

## 2012-03-01 ENCOUNTER — Encounter (HOSPITAL_COMMUNITY): Payer: Self-pay

## 2012-03-01 ENCOUNTER — Emergency Department (HOSPITAL_COMMUNITY)
Admission: EM | Admit: 2012-03-01 | Discharge: 2012-03-01 | Disposition: A | Discharge status: Home / Self Care | Payer: Self-pay | Attending: Emergency Medicine | Admitting: Emergency Medicine

## 2012-03-01 ENCOUNTER — Emergency Department (HOSPITAL_COMMUNITY): Payer: Self-pay

## 2012-03-01 DIAGNOSIS — S62339A Displaced fracture of neck of unspecified metacarpal bone, initial encounter for closed fracture: Secondary | ICD-10-CM | POA: Insufficient documentation

## 2012-03-01 DIAGNOSIS — W19XXXA Unspecified fall, initial encounter: Secondary | ICD-10-CM | POA: Insufficient documentation

## 2012-03-01 MED ORDER — IBUPROFEN 800 MG PO TABS: 800.0000 mg | ORAL_TABLET | Freq: Three times a day (TID) | ORAL | Status: AC

## 2012-03-01 MED ORDER — IBUPROFEN 800 MG PO TABS
800.0000 mg | ORAL_TABLET | Freq: Once | ORAL | Status: AC
  Administered 2012-03-01: 800 mg via ORAL

## 2012-03-01 MED ORDER — IBUPROFEN 800 MG PO TABS
ORAL_TABLET | ORAL | Status: AC
  Filled 2012-03-01: qty 1

## 2012-03-01 NOTE — ED Notes (Signed)
Pt ambulatory to and from radiology with tech, tolerated well. 

## 2012-03-01 NOTE — ED Provider Notes (Signed)
Medical screening examination/treatment/procedure(s) were performed by non-physician practitioner and as supervising physician I was immediately available for consultation/collaboration.   Kyran Connaughton, MD 03/01/12 1615 

## 2012-03-01 NOTE — ED Notes (Signed)
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  Pt reports fall was 3-4 feet, landing on concrete.

## 2012-03-01 NOTE — ED Provider Notes (Signed)
History     CSN: [id number]  Arrival date & time 03/01/12  0753   None     Chief Complaint  Patient presents with  . Arm Pain    (Consider location/radiation/quality/duration/timing/severity/associated sxs/prior treatment) Patient is a 26 y.o. male presenting with arm pain. The history is provided by the patient.  Arm Pain This is a new problem. The current episode started in the past 7 days. The problem occurs constantly. The problem has been unchanged. Pertinent negatives include no abdominal pain, chest pain, neck pain or numbness. Associated symptoms comments: Larey Seat one week ago onto left hand with pain and swelling since that time. No numbness. No other injury.Marland Kitchen    History reviewed. No pertinent past medical history.  History reviewed. No pertinent past surgical history.  No family history on file.  History  Substance Use Topics  . Smoking status: Passive Smoker -- 1.0 packs/day  . Smokeless tobacco: Not on file  . Alcohol Use: Yes      Review of Systems  HENT: Negative for neck pain.   Cardiovascular: Negative for chest pain.  Gastrointestinal: Negative for abdominal pain.  Musculoskeletal:       See HPI.  Skin: Negative for wound.  Neurological: Negative for numbness.    Allergies  Penicillins  Home Medications   Current Outpatient Rx  Name Route Sig Dispense Refill  . IBUPROFEN 200 MG PO TABS Oral Take 800 mg by mouth every 6 (six) hours as needed. For pain      BP 118/76  Pulse 80  Temp(Src) 97.8 F (36.6 C) (Oral)  Resp 18  Ht 5\' 11"  (1.803 m)  Wt 213 lb (96.616 kg)  BMI 29.71 kg/m2  SpO2 98%  Physical Exam  Constitutional: He appears well-developed and well-nourished. No distress.  Musculoskeletal:       Left hand mildly swollen without scabbing or lesion. No redness or warmth. Tender over 5th distal MC. Full range of motion of 5th digit with limitation of pain, no tendon deficits. Normal neurosensory exam.     ED Course  Procedures  (including critical care time)  Labs Reviewed - No data to display Dg Hand Complete Left  03/01/2012  *RADIOLOGY REPORT*  Clinical Data: Fall with pain.  LEFT HAND - COMPLETE 3+ VIEW  Comparison: None.  Findings: There is a mildly angulated fracture of the fifth metacarpal neck.  Difficult to determine if the fracture line extends into the metacarpal phalangeal joint.  Overlying soft tissue swelling.  No additional evidence of fracture.  IMPRESSION: Fifth metacarpal neck fracture.  Original Report Authenticated By: Reyes Ivan, M.D.     No diagnosis found.  1. Fracture 5th MC, closed  MDM  Injury limited to left hand with fracture on x-ray. Splint and refer to hand ortho.        Rodena Medin, PA-C 03/01/12 0840

## 2012-03-01 NOTE — Discharge Instructions (Signed)
ICE AND ELEVATE TO REDUCE SWELLING. TAKE IBUPROFEN AS DIRECTED TO ALSO HELP REDUCE SWELLING. WEAR SPLINT UNTIL SEEN BY DR. Merlyn Lot NEXT WEEK. CALL HIS OFFICE TO SCHEDULE AN APPOINTMENT. RETURN AS NEEDED.   Cast or Splint Care Casts and splints support injured limbs and keep bones from moving while they heal.  HOME CARE  Keep the cast or splint uncovered during the drying period.   A plaster cast can take 24 to 48 hours to dry.   A fiberglass cast will dry in less than 1 hour.   Do not rest the cast on anything harder than a pillow for 24 hours.   Do not put weight on your injured limb. Do not put pressure on the cast. Wait for your doctor's approval.   Keep the cast or splint dry.   Cover the cast or splint with a plastic bag during baths or wet weather.   If you have a cast over your chest and belly (trunk), take sponge baths until the cast is taken off.   Keep your cast or splint clean. Wash a dirty cast with a damp cloth.   Do not put any objects under your cast or splint. Do not scratch the skin under the cast with an object.   Do not take out the padding from inside your cast.   Exercise your joints near the cast as told by your doctor.   Raise (elevate) your injured limb on 1 or 2 pillows for the first 1 to 3 days.  GET HELP RIGHT AWAY IF:  Your cast or splint cracks.   Your cast or splint is too tight or too loose.   You itch badly under the cast.   Your cast gets wet or has a soft spot.   You have a bad smell coming from the cast.   You get an object stuck under the cast.   Your skin around the cast becomes red or raw.   You have new or more pain after the cast is put on.   You have fluid leaking through the cast.   You cannot move your fingers or toes.   Your fingers or toes turn colors or are cool, painful, or puffy (swollen).   You have tingling or lose feeling (numbness) around the injured area.   You have pain or pressure under the cast.   You  have trouble breathing or have shortness of breath.   You have chest pain.  MAKE SURE YOU:  Understand these instructions.   Will watch your condition.   Will get help right away if you are not doing well or get worse.  Document Released: 01/12/2011 Document Revised: 09/01/2011 Document Reviewed: 01/12/2011 [facility] Patient Information 2012 [place], [place]. Boxer's Fracture You have a break (fracture) of the fifth metacarpal bone. This is commonly called a boxer's fracture. This is the bone in the hand where the little finger attaches. The fracture is in the end of that bone, closest to the little finger. It is usually caused when you hit an object with a clenched fist. Often, the knuckle is pushed down by the impact. Sometimes, the fracture rotates out of position. A boxer's fracture will usually heal within 6 weeks, if it is treated properly and protected from re-injury. Surgery is sometimes needed. A cast, splint, or bulky hand dressing may be used to protect and immobilize a boxer's fracture. Do not remove this device or dressing until your caregiver approves. Keep your hand elevated, and apply ice  packs for 15 to 20 minutes every 2 hours, for the first 2 days. Elevation and ice help reduce swelling and relieve pain. See your caregiver, or an orthopedic specialist, for follow-up care within the next 10 days. This is to make sure your fracture is healing properly. Document Released: 09/12/2005 Document Revised: 09/01/2011 Document Reviewed: 03/02/2007 [facility] Patient Information 2012 [place], [place].

## 2012-03-01 NOTE — ED Notes (Signed)
Ice pack applied to L hand

## 2012-12-06 ENCOUNTER — Encounter (HOSPITAL_COMMUNITY): Payer: Self-pay | Admitting: Cardiology

## 2012-12-06 ENCOUNTER — Emergency Department (HOSPITAL_COMMUNITY)
Admission: EM | Admit: 2012-12-06 | Discharge: 2012-12-06 | Disposition: A | Discharge status: Home / Self Care | Payer: Self-pay | Attending: Emergency Medicine | Admitting: Emergency Medicine

## 2012-12-06 DIAGNOSIS — X503XXA Overexertion from repetitive movements, initial encounter: Secondary | ICD-10-CM | POA: Insufficient documentation

## 2012-12-06 DIAGNOSIS — S239XXA Sprain of unspecified parts of thorax, initial encounter: Secondary | ICD-10-CM | POA: Insufficient documentation

## 2012-12-06 DIAGNOSIS — Y9389 Activity, other specified: Secondary | ICD-10-CM | POA: Insufficient documentation

## 2012-12-06 DIAGNOSIS — Y990 Civilian activity done for income or pay: Secondary | ICD-10-CM | POA: Insufficient documentation

## 2012-12-06 DIAGNOSIS — Y9289 Other specified places as the place of occurrence of the external cause: Secondary | ICD-10-CM | POA: Insufficient documentation

## 2012-12-06 MED ORDER — HYDROCODONE-ACETAMINOPHEN 5-325 MG PO TABS: 1.0000 | ORAL_TABLET | Freq: Four times a day (QID) | ORAL | Status: DC | PRN

## 2012-12-06 MED ORDER — CYCLOBENZAPRINE HCL 10 MG PO TABS: 10.0000 mg | ORAL_TABLET | Freq: Two times a day (BID) | ORAL | Status: DC | PRN

## 2012-12-06 MED ORDER — IBUPROFEN 800 MG PO TABS: 800.0000 mg | ORAL_TABLET | Freq: Three times a day (TID) | ORAL | Status: DC

## 2012-12-06 NOTE — ED Notes (Signed)
Pt reports chronic back pain for the past couple of months. 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 Denies any injury.

## 2012-12-06 NOTE — ED Provider Notes (Signed)
History    This chart was scribed for non-physician practitioner working with Dione Booze, MD by Donne Anon, ED Scribe. This patient was seen in room TR09C/TR09C and the patient's care was started at 1531.   CSN: [id number]  Arrival date & time 12/06/12  1503   First MD Initiated Contact with Patient 12/06/12 1531      Chief Complaint  Patient presents with  . Back Pain      The history is provided by the patient. No language interpreter was used.   Andrew Keller is a 27 y.o. male who presents to the Emergency Department complaining of gradual onset, chronic, gradually worsening, mid back pain (rated 10/10) described as sharp which radiates up to his right shoulder which began a couple of months ago. He reports the pain is worse when he bends, twists, moves his shoulder or lifts objects. He denies neck pain, numbness, tingling, CP or any other pain. He has tried Advil with mild relief.   History reviewed. No pertinent past medical history.  History reviewed. No pertinent past surgical history.  History reviewed. No pertinent family history.  History  Substance Use Topics  . Smoking status: Passive Smoke Exposure - Never Smoker -- 1.00 packs/day  . Smokeless tobacco: Not on file  . Alcohol Use: Yes      Review of Systems  HENT: Negative for neck pain.   Cardiovascular: Negative for chest pain.  Musculoskeletal: Positive for back pain.  Neurological: Negative for numbness.  All other systems reviewed and are negative.    Allergies  Penicillins  Home Medications   Current Outpatient Rx  Name  Route  Sig  Dispense  Refill  . ibuprofen (ADVIL,MOTRIN) 200 MG tablet   Oral   Take 600 mg by mouth every 6 (six) hours as needed for pain. For pain           BP 135/82  Pulse 94  Temp(Src) 98 F (36.7 C) (Oral)  Resp 18  SpO2 100%  Physical Exam  Nursing note and vitals reviewed. Constitutional: He is oriented to person, place, and time. He appears  well-developed and well-nourished. No distress.  HENT:  Head: Normocephalic and atraumatic.  Eyes: Conjunctivae and EOM are normal.  Neck: Normal range of motion. Neck supple.  Cardiovascular: Normal rate, regular rhythm and normal heart sounds.   Pulmonary/Chest: Effort normal and breath sounds normal.  Abdominal: Soft. Bowel sounds are normal. He exhibits no distension.  Musculoskeletal: Normal range of motion. He exhibits no edema.  No bony spinal tenderness. Tenderness to palpation of right thoracic paraspinal muscles with muscle spasms. Tenderness over muscles over right scapula region. Full ROM and full right shoulder ROM.  Neurological: He is alert and oriented to person, place, and time.  Sensation and strength in tact and 5/5 bilaterally for upper and lower extremities.  Skin: Skin is warm and dry.  Psychiatric: He has a normal mood and affect. His behavior is normal.    ED Course  Procedures (including critical care time) DIAGNOSTIC STUDIES: Oxygen Saturation is 100% on room air, normal by my interpretation.    COORDINATION OF CARE: 3:40 PMDiscussed treatment plan which includes pain medication, muscle relaxer, rest and heat with pt at bedside and pt agreed to plan.     Labs Reviewed - No data to display No results found.   1. Back strain, initial encounter       MDM  27 year old male with right-sided mid back strain. Muscle spasm present. I will  treat him with Flexeril, ibuprofen and 8 Vicodin. Conservative measures discussed. No red flags concerning patient's back pain. No focal neurologic deficits. He is ambulating without difficulty. Lung examination unremarkable. Return precautions discussed. Patient is understanding of plan and is agreeable.  I personally performed the services described in this documentation, which was scribed in my presence. The recorded information has been reviewed and is accurate.       Trevor Mace, PA-C 12/06/12 1605

## 2012-12-07 NOTE — ED Provider Notes (Signed)
Medical screening examination/treatment/procedure(s) were performed by non-physician practitioner and as supervising physician I was immediately available for consultation/collaboration.   David Glick, MD 12/07/12 0046 

## 2013-07-03 ENCOUNTER — Emergency Department (HOSPITAL_COMMUNITY): Payer: Self-pay

## 2013-07-03 ENCOUNTER — Inpatient Hospital Stay (HOSPITAL_COMMUNITY)
Admission: EM | Admit: 2013-07-03 | Discharge: 2013-07-06 | DRG: 138 | Disposition: A | Discharge status: Home / Self Care | Payer: Self-pay | Attending: Family Medicine | Admitting: Family Medicine

## 2013-07-03 ENCOUNTER — Encounter (HOSPITAL_COMMUNITY): Payer: Self-pay | Admitting: Emergency Medicine

## 2013-07-03 DIAGNOSIS — K0889 Other specified disorders of teeth and supporting structures: Secondary | ICD-10-CM | POA: Diagnosis present

## 2013-07-03 DIAGNOSIS — K045 Chronic apical periodontitis: Secondary | ICD-10-CM | POA: Diagnosis present

## 2013-07-03 DIAGNOSIS — K047 Periapical abscess without sinus: Principal | ICD-10-CM | POA: Diagnosis present

## 2013-07-03 DIAGNOSIS — M272 Inflammatory conditions of jaws: Secondary | ICD-10-CM | POA: Diagnosis present

## 2013-07-03 DIAGNOSIS — F17200 Nicotine dependence, unspecified, uncomplicated: Secondary | ICD-10-CM | POA: Diagnosis present

## 2013-07-03 DIAGNOSIS — K083 Retained dental root: Secondary | ICD-10-CM | POA: Diagnosis present

## 2013-07-03 DIAGNOSIS — R220 Localized swelling, mass and lump, head: Secondary | ICD-10-CM | POA: Diagnosis present

## 2013-07-03 LAB — POCT I-STAT, CHEM 8
BUN: 6 mg/dL (ref 6–23)
Calcium, Ion: 1.19 mmol/L (ref 1.12–1.23)
Glucose, Bld: 98 mg/dL (ref 70–99)
HCT: 52 % (ref 39.0–52.0)
Hemoglobin: 17.7 g/dL — ABNORMAL HIGH (ref 13.0–17.0)
Sodium: 138 mEq/L (ref 135–145)
TCO2: 27 mmol/L (ref 0–100)

## 2013-07-03 LAB — CBC
HCT: 45.8 % (ref 39.0–52.0)
MCH: 31.5 pg (ref 26.0–34.0)
MCHC: 35.1 g/dL (ref 30.0–36.0)
MCHC: 34.7 g/dL (ref 30.0–36.0)
Platelets: 259 10*3/uL (ref 150–400)
Platelets: 246 10*3/uL (ref 150–400)
RDW: 13.4 % (ref 11.5–15.5)
RDW: 13.6 % (ref 11.5–15.5)

## 2013-07-03 LAB — POCT I-STAT CREATININE: Creatinine, Ser: 1.3 mg/dL (ref 0.50–1.35)

## 2013-07-03 MED ORDER — SENNA 8.6 MG PO TABS: 1.0000 | ORAL_TABLET | Freq: Every day | ORAL | Status: DC | PRN

## 2013-07-03 MED ORDER — IOHEXOL 300 MG/ML  SOLN
80.0000 mL | Freq: Once | INTRAMUSCULAR | Status: AC | PRN
  Administered 2013-07-03: 80 mL via INTRAVENOUS

## 2013-07-03 MED ORDER — ACETAMINOPHEN 325 MG PO TABS
650.0000 mg | ORAL_TABLET | Freq: Four times a day (QID) | ORAL | Status: DC
  Administered 2013-07-03 – 2013-07-05 (×6): 650 mg via ORAL
  Filled 2013-07-03 (×6): qty 2

## 2013-07-03 MED ORDER — MORPHINE SULFATE 2 MG/ML IJ SOLN
2.0000 mg | Freq: Once | INTRAMUSCULAR | Status: AC
  Administered 2013-07-03: 2 mg via INTRAVENOUS
  Filled 2013-07-03: qty 1

## 2013-07-03 MED ORDER — CLINDAMYCIN PHOSPHATE 600 MG/50ML IV SOLN
600.0000 mg | Freq: Three times a day (TID) | INTRAVENOUS | Status: DC
  Administered 2013-07-04 – 2013-07-05 (×4): 600 mg via INTRAVENOUS
  Filled 2013-07-03 (×6): qty 50

## 2013-07-03 MED ORDER — CLINDAMYCIN PHOSPHATE 900 MG/50ML IV SOLN
900.0000 mg | Freq: Once | INTRAVENOUS | Status: AC
  Administered 2013-07-03: 900 mg via INTRAVENOUS
  Filled 2013-07-03: qty 50

## 2013-07-03 MED ORDER — HEPARIN SODIUM (PORCINE) 5000 UNIT/ML IJ SOLN
5000.0000 [IU] | Freq: Three times a day (TID) | INTRAMUSCULAR | Status: DC
  Administered 2013-07-03 – 2013-07-04 (×3): 5000 [IU] via SUBCUTANEOUS
  Filled 2013-07-03 (×4): qty 1

## 2013-07-03 MED ORDER — OXYCODONE-ACETAMINOPHEN 5-325 MG PO TABS
2.0000 | ORAL_TABLET | Freq: Once | ORAL | Status: AC
  Administered 2013-07-03: 2 via ORAL
  Filled 2013-07-03: qty 2

## 2013-07-03 MED ORDER — MORPHINE SULFATE 2 MG/ML IJ SOLN: 2.0000 mg | INTRAMUSCULAR | Status: DC | PRN

## 2013-07-03 MED ORDER — SODIUM CHLORIDE 0.9 % IV BOLUS (SEPSIS)
1000.0000 mL | Freq: Once | INTRAVENOUS | Status: AC
  Administered 2013-07-03: 1000 mL via INTRAVENOUS

## 2013-07-03 MED ORDER — FENTANYL CITRATE 0.05 MG/ML IJ SOLN
50.0000 ug | Freq: Once | INTRAMUSCULAR | Status: AC
  Administered 2013-07-03: 50 ug via INTRAVENOUS
  Filled 2013-07-03: qty 2

## 2013-07-03 MED ORDER — MORPHINE SULFATE 2 MG/ML IJ SOLN
1.0000 mg | INTRAMUSCULAR | Status: DC | PRN
  Administered 2013-07-03: 1 mg via INTRAVENOUS
  Filled 2013-07-03: qty 1

## 2013-07-03 MED ORDER — ACETAMINOPHEN 650 MG RE SUPP
650.0000 mg | Freq: Four times a day (QID) | RECTAL | Status: DC
  Filled 2013-07-03 (×8): qty 1

## 2013-07-03 MED ORDER — HYDROMORPHONE HCL PF 1 MG/ML IJ SOLN
0.5000 mg | INTRAMUSCULAR | Status: DC | PRN
  Administered 2013-07-03 – 2013-07-05 (×8): 0.5 mg via INTRAVENOUS
  Filled 2013-07-03 (×8): qty 1

## 2013-07-03 NOTE — ED Notes (Signed)
Pt has returned from being out of the department; pt placed back on monitor, continuous pulse oximetry and blood pressure cuff; family at bedside 

## 2013-07-03 NOTE — ED Notes (Signed)
Per pt sts right sided face pain due to abscessed tooth. Pt face very swollen.

## 2013-07-03 NOTE — ED Provider Notes (Signed)
CSN: [id number]     Arrival date & time 07/03/13  1228 History   First MD Initiated Contact with Patient 07/03/13 1342     Chief Complaint  Patient presents with  . Abscess   (Consider location/radiation/quality/duration/timing/severity/associated sxs/prior Treatment) Patient is a 27 y.o. male presenting with tooth pain. The history is provided by the patient. No language interpreter was used.  Dental Pain Location:  Upper Upper teeth location: R upper. Quality:  Aching Severity:  Moderate Onset quality:  Gradual Duration:  2 days Timing:  Constant Progression:  Worsening Chronicity:  New Context: dental caries and poor dentition   Context: not trauma   Relieved by:  Nothing Worsened by:  Cold food/drink, hot food/drink, touching, pressure and jaw movement Associated symptoms: facial pain, facial swelling and trismus   Associated symptoms: no drooling, no fever, no neck pain, no neck swelling, no oral bleeding and no oral lesions   Risk factors: lack of dental care     History reviewed. No pertinent past medical history. History reviewed. No pertinent past surgical history. History reviewed. No pertinent family history. History  Substance Use Topics  . Smoking status: Passive Smoke Exposure - Never Smoker -- 1.00 packs/day  . Smokeless tobacco: Not on file  . Alcohol Use: Yes    Review of Systems  Constitutional: Negative for fever.  HENT: Positive for dental problem and facial swelling. Negative for drooling, mouth sores, sore throat and trouble swallowing.   Musculoskeletal: Negative for neck pain.    Allergies  Penicillins  Home Medications  No current outpatient prescriptions on file. BP 112/60  Pulse 74  Temp(Src) 99.7 F (37.6 C)  Resp 20  Ht 5\' 11"  (1.803 m)  Wt 202 lb (91.627 kg)  BMI 28.19 kg/m2  SpO2 97%  Physical Exam  Nursing note and vitals reviewed. Constitutional: He is oriented to person, place, and time. He appears well-developed and  well-nourished. No distress.  HENT:  Head: Normocephalic and atraumatic.  Mouth/Throat: Uvula is midline, oropharynx is clear and moist and mucous membranes are normal.  Significant right-sided facial swelling. Swelling extends to the edge of the right nare; nares patent. Positive trismus. Tenderness to palpation to the right upper dentition. Airway patent and patient tolerating secretions without difficulty or drooling. No oral lesions or bleeding appreciated.  Eyes: Conjunctivae and EOM are normal. Pupils are equal, round, and reactive to light. No scleral icterus.  Neck: Normal range of motion. Neck supple.  No nuchal rigidity or meningeal signs  Cardiovascular: Normal rate, regular rhythm and intact distal pulses.   Pulmonary/Chest: Effort normal. No stridor. No respiratory distress.  Musculoskeletal: Normal range of motion.  Neurological: He is alert and oriented to person, place, and time.  Skin: Skin is warm and dry. No rash noted. He is not diaphoretic. No erythema. No pallor.  Psychiatric: He has a normal mood and affect. His behavior is normal.    ED Course  Procedures (including critical care time) Labs Review Labs Reviewed  CBC  POCT I-STAT CREATININE   Imaging Review Ct Maxillofacial W/cm  07/03/2013   CLINICAL DATA:  Abscess tooth. Right-sided facial pain.  EXAM: CT MAXILLOFACIAL WITH CONTRAST  TECHNIQUE: Multidetector CT imaging of the maxillofacial structures was performed with intravenous contrast. Multiplanar CT image reconstructions were also generated. A small metallic BB was placed on the right temple in order to reliably differentiate right from left.  CONTRAST:  80mL OMNIPAQUE IOHEXOL 300 MG/ML  SOLN  COMPARISON:  None.  FINDINGS: There is  a 12 mm lytic bone lesion with cortical breakthrough just above the root of a bicuspid within the right side of the maxilla. Teres involving right bicuspid teeth are noted. This is compatible with periapical abscess and osteomyelitis  in the maxilla. Fluid density extends out of the lytic bone lesion and into the overlying buccal soft tissues consistent with a 1.4 x 1.9 cm abscess.  Teres involving several of the other teeth are noted. Periapical lucencies around bilateral or bicuspids and molars is compatible with other areas of periapical abscess about the roots. Mucosal thickening within the right maxillary sinus is also present.  The airway is patent. Adenoidal tonsillar tissue is moderately prominent. The visualized larynx is unremarkable. Small cervical nodes are seen bilaterally. The internal jugular veins are grossly patent. Carotid arteries are grossly patent. Remainder of the paranasal sinuses are clear. Mastoid air cells are clear.  IMPRESSION: There is a 1.2 cm lytic bone lesion and osteomyelitis with cortical breakthrough in the right maxilla. This is associated with a 1.4 x 1.9 cm abscess of the right upper bicuspid. Several areas of dental caries and periapical abscess involving lower teeth are also noted.   Electronically Signed   By: Maryclare Bean M.D.   On: 07/03/2013 16:58    MDM   1. Dental abscess   2. Osteomyelitis of maxilla    27 year old male presents for right-sided facial swelling x2 days it has been gradually worsening. Symptoms are associated with dental pain. Patient denies fevers, neck pain or stiffness, inability to swallow, and drooling. Patient well and nontoxic appearing on arrival and hemodynamically stable; afebrile. Physical exam findings as above. CT maxillofacial ordered for further evaluation of symptoms. CT significant for 1.2 cm lytic bone lesion and osteomyelitis of the right maxilla. Osteomyelitis secondary to 1.4 x 1.9 cm abscess of the right upper bicuspid. Have consulted with family medicine who will admit the patient for treatment with IV antibiotics. IV fluids and IV clindamycin ordered. Pain well controlled at this time. Admission orders to be placed by family medicine.    Antony Madura,  PA-C 07/03/13 1723

## 2013-07-03 NOTE — H&P (Signed)
Family Medicine Teaching [facility] Admission History and Physical Service Pager: [phone]  Patient name: Andrew Keller Medical record number: [id number] Date of birth: Jun 09, 1986 Age: 27 y.o. Gender: male  Primary Care Provider: Default, Provider, MD Consultants: oral surgery/dental Code Status: Full  Chief Complaint: Facial swelling  Assessment and Plan: Andrew Keller is a 26 y.o. male presenting with facial swelling found to have dental abscess and maxillary osteomyelitis on CT . PMH is significant for past dental problems and no recent dental care. Currently protecting airway and in NAD, no meningeal signs.  # Dental abscess, maxillary osteo: normal WBC, no documented fevers - IV clinda 600mg  TID - c/s to oral surgery in am - f/u blood cultures - pain control with IV morphine  # Tobacco abuse: - will place order for RN to provide smoking cessation materials  FEN/GI: mechanical soft diet, NPO at midnight for possible dental procedure, saline lock IV Prophylaxis: SQ heparin  Disposition: admit to floor for IV antibiotics and possible dental surgery  History of Present Illness: Andrew Keller is a 27 y.o. male presenting with 2 days of worsening facial pain and swelling. It was accompanied by chills and subjective fever. It started two days ago and did not improve, so he presented to the ER for evaluation. He does not have a regular dentist and has not sought care for this prior to coming to the ER. He reports a similar incident as a teenager for which he saw a dentist but it did not get this bad. He has been able to eat soft foods and drink without problems. No nausea/vomiting. Endorses HA. No vision or smell changes. No history of abscesses or frequent infections.  Review Of Systems: Per HPI with the following additions: no bowel or urinary changes.  Otherwise 12 point review of systems was performed and was unremarkable.  Patient Active Problem List    Diagnosis Date Noted  . 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 Dental abscess 07/03/2013   Past Medical History: History reviewed. No pertinent past medical history. Past Surgical History: History reviewed. No pertinent past surgical history. Social History: History  Substance Use Topics  . Smoking status: Current Every Day Smoker -- 1.00 packs/day for 10 years    Types: Cigarettes  . Smokeless tobacco: Not on file  . Alcohol Use: 2 - 3 oz/week    4-6 drink(s) per week   Additional social history: smoker, 10 pack year history, wants to quit. Occasional drinker (2-3 drinks, 2-3 times per week) No illicit drug use. Please also refer to relevant sections of EMR.  Family History: History reviewed. No pertinent family history. Allergies and Medications: Allergies  Allergen Reactions  . Penicillins Other (See Comments)    unknown   No current facility-administered medications on file prior to encounter.   No current outpatient prescriptions on file prior to encounter.    Objective: BP 146/66  Pulse 78  Temp(Src) 99.7 F (37.6 C)  Resp 18  Ht 5\' 11"  (1.803 m)  Wt 202 lb (91.627 kg)  BMI 28.19 kg/m2  SpO2 93% Exam: General: lying in bed in NAD HEENT: MMM, EOMI, PERRL, TMs clear, neck supple w/ normal rom, poor dentition, marked R-sided facial swelling, swelling around R lower molars, not able to completely open mouth secondary to pain and swelling Cardiovascular: RRR, no MRG, 2+ dp pulses Respiratory: CTAB, normal wob Abdomen: soft, nontender, nondistended, normoactive bs Extremities: no LE edema Skin: no rashes or lesions Neuro: alert, oriented, no  focal deficits  Labs and Imaging: CBC BMET   Recent Labs Lab 07/03/13 1706 07/03/13 1753  WBC 9.7  --   HGB 16.2 17.7*  HCT 46.2 52.0  PLT 259  --     Recent Labs Lab 07/03/13 1753  NA 138  K 3.7  CL 98  BUN 6  CREATININE 1.20  GLUCOSE 98     CT maxillofacial: There is a 1.2 cm lytic bone lesion  and osteomyelitis with cortical breakthrough in the right maxilla. This is associated with a 1.4 x 1.9 cm abscess of the right upper bicuspid. Several areas of dental caries and periapical abscess involving lower teeth are also noted.  Beverely Low, MD 07/03/2013, 7:46 PM PGY-1,  Family Medicine FPTS Intern pager: [phone], text pages welcome   Upper Level Addendum:  I have seen and evaluated this patient along with Dr. Richarda Blade and reviewed the above note, making necessary revisions in pink.   Levert Feinstein, MD Family Medicine PGY-2

## 2013-07-03 NOTE — ED Notes (Signed)
Pt returned from CT °

## 2013-07-03 NOTE — ED Notes (Signed)
Patient ambulated to bathroom, with steady gait, pt states his pain +3 decreased from time morphine was administed.

## 2013-07-04 ENCOUNTER — Encounter (HOSPITAL_COMMUNITY): Payer: Self-pay | Admitting: Dentistry

## 2013-07-04 ENCOUNTER — Inpatient Hospital Stay (HOSPITAL_COMMUNITY): Payer: Self-pay

## 2013-07-04 DIAGNOSIS — K045 Chronic apical periodontitis: Secondary | ICD-10-CM

## 2013-07-04 DIAGNOSIS — K029 Dental caries, unspecified: Secondary | ICD-10-CM

## 2013-07-04 DIAGNOSIS — R221 Localized swelling, mass and lump, neck: Secondary | ICD-10-CM

## 2013-07-04 DIAGNOSIS — M272 Inflammatory conditions of jaws: Secondary | ICD-10-CM

## 2013-07-04 DIAGNOSIS — K047 Periapical abscess without sinus: Principal | ICD-10-CM

## 2013-07-04 DIAGNOSIS — R220 Localized swelling, mass and lump, head: Secondary | ICD-10-CM

## 2013-07-04 LAB — RAPID URINE DRUG SCREEN, HOSP PERFORMED: Opiates: POSITIVE — AB

## 2013-07-04 LAB — RPR: RPR Ser Ql: NONREACTIVE

## 2013-07-04 LAB — CREATININE, SERUM
Creatinine, Ser: 1.14 mg/dL (ref 0.50–1.35)
GFR calc non Af Amer: 88 mL/min — ABNORMAL LOW (ref >90)

## 2013-07-04 MED ORDER — HYDROMORPHONE HCL PF 1 MG/ML IJ SOLN
1.0000 mg | Freq: Once | INTRAMUSCULAR | Status: AC
  Administered 2013-07-04: 1 mg via INTRAVENOUS
  Filled 2013-07-04: qty 1

## 2013-07-04 MED ORDER — INFLUENZA VAC SPLIT QUAD 0.5 ML IM SUSP
0.5000 mL | INTRAMUSCULAR | Status: AC
  Filled 2013-07-04: qty 0.5

## 2013-07-04 MED ORDER — PNEUMOCOCCAL VAC POLYVALENT 25 MCG/0.5ML IJ INJ
0.5000 mL | INJECTION | INTRAMUSCULAR | Status: AC
  Filled 2013-07-04: qty 0.5

## 2013-07-04 NOTE — Progress Notes (Signed)
Family Medicine Teaching Service Daily Progress Note Intern Pager: [phone]  Patient name: Andrew Keller Medical record number: [id number] Date of birth: 1986/05/14 Age: 27 y.o. Gender: male  Primary Care Provider: Default, Provider, MD Consultants: oral surgery Code Status: full  Pt Overview and Major Events to Date:  10/8 - Admitted with dental abscess and osteo of maxilla  Assessment and Plan: Andrew Keller is a 27 y.o. male presenting with facial swelling found to have dental abscess and maxillary osteomyelitis on CT . PMH is significant for past dental problems and no recent dental care. Currently protecting airway and in NAD, no meningeal signs.   # Dental abscess, maxillary osteo: normal WBC, no documented fevers  - IV clinda 600mg  TID  - c/s to oral surgery - f/u blood cultures  - pain control escalated to IV dilaudid 0.5mg  q3h   # Tobacco abuse:  - will place order for RN to provide smoking cessation materials   FEN/GI: mechanical soft diet, NPO at midnight for possible dental procedure, saline lock IV  Prophylaxis: SQ heparin  Disposition: floor pending clinical improvement and further work-up  Subjective: Feeling better this morning, pain well controlled, wants to go outside for a cigarette, wants to eat  Objective: Temp:  [98.2 F (36.8 C)-99.7 F (37.6 C)] 98.7 F (37.1 C) (10/09 0535) Pulse Rate:  [71-100] 76 (10/09 0535) Resp:  [18-20] 18 (10/09 0535) BP: (112-146)/(58-84) 115/58 mmHg (10/09 0535) SpO2:  [93 %-98 %] 98 % (10/09 0535) Weight:  [202 lb (91.627 kg)-202 lb 9.6 oz (91.9 kg)] 202 lb 9.6 oz (91.9 kg) (10/08 2054) Physical Exam: General: lying in bed in NAD  HEENT: MMM, EOMI, neck supple w/ normal rom, poor dentition, marked R-sided facial swelling, swelling around R upper and lower molars, not able to completely open mouth secondary to pain and swelling  Cardiovascular: RRR, no MRG, 2+ dp pulses  Respiratory: CTAB, normal wob  Abdomen:  soft, nontender, nondistended Extremities: no LE edema  Skin: no rashes or lesions  Neuro: alert, oriented, no focal deficits   Laboratory:  Recent Labs Lab 07/03/13 1706 07/03/13 1753 07/03/13 2325  WBC 9.7  --  10.1  HGB 16.2 17.7* 15.9  HCT 46.2 52.0 45.8  PLT 259  --  246    Recent Labs Lab 07/03/13 1519 07/03/13 1753 07/03/13 2325  NA  --  138  --   K  --  3.7  --   CL  --  98  --   BUN  --  6  --   CREATININE 1.30 1.20 1.14  GLUCOSE  --  98  --    BCx pending  Imaging/Diagnostic Tests: CT maxillofacial: There is a 1.2 cm lytic bone lesion and osteomyelitis with cortical breakthrough in the right maxilla. This is associated with a 1.4 x 1.9 cm abscess of the right upper bicuspid. Several areas of dental caries and periapical abscess involving lower teeth are also noted.  Beverely Low, MD 07/04/2013, 9:14 AM PGY-1, [facility] FPTS Intern pager: [phone], text pages welcome

## 2013-07-04 NOTE — H&P (Signed)
FMTS Attending Admit Note Patient seen and examined by me, discussed with resident team and I agree with Dr Valorie Roosevelt assess/plan.  Briefly, patient is a previously healthy 27yo male who presents with acute worsening of R-sided facial swelling, subjective fevers/chills at home over the past 3 days.  Had previously had some low-level pain that was adequately controlled with OTC tylenol, however this worsened in the past 3 days, became tender to light touch.  Able to eat on L side of mouth.  No odynophagia, no difficulty with breathing.  Of note, strong family hx of DM (in parents, "everyone in my family").  Patient has been tested for HIV in the past and negative.  Exam: Generally well appearing, no acute distress. Marked edema of R side of face, maxillary area.  Exquisitely tender to palpate along R maxillary sinus and lateral to sinus.  No tenderness to palpate along TMJ/submandibular.  Neck supple and full active ROM.  COR Regular S1S2  A/P: Patient with odontogenic infection, reported concern for osteomyelitis of maxilla on the R by CT.  Dental surgery consult for further evaluation.  Pain control.  Continue clindamycin for now.  I agree with checking HIV and A1C in this patient with advanced infection, no previously identified immunocompromise. Paula Compton, MD

## 2013-07-04 NOTE — ED Provider Notes (Signed)
Medical screening examination/treatment/procedure(s) were performed by non-physician practitioner and as supervising physician I was immediately available for consultation/collaboration.  Patient discussed between PA in family practice admitting team. Oral surgery/dentistry not available. Patient treated with IV antibiotics and admitted for further care and a.m. dental evaluation   Roney Marion, MD 07/04/13 [phone]

## 2013-07-04 NOTE — Progress Notes (Signed)
FMTS Attending Note  The plan of care was discussed with the resident team. I agree with the assessment and plan as documented by the resident.   Merrily Tegeler MD 

## 2013-07-04 NOTE — Consult Note (Signed)
DENTAL CONSULTATION  Date of Consultation:  07/04/2013 Patient Name:   Andrew Keller Date of Birth:   1986/02/02 Medical Record Number: [id number]  VITALS: BP 117/69  Pulse 57  Temp(Src) 98 F (36.7 C) (Oral)  Resp 18  Ht 5\' 11"  (1.803 m)  Wt 202 lb 9.6 oz (91.9 kg)  BMI 28.27 kg/m2  SpO2 100%   CHIEF COMPLAINT: Patient referred for evaluation of right facial swelling. HPI: Andrew Keller is a 27 year old male referred by family practice for dental consultation. Patient with known right facial swelling and periapical pathology.  A dental consultation was requested to evaluate right facial swelling and provide treatment as indicated for probable dental etiology.  The patient currently is complaining of right facial swelling and pain for the past 2-3 days. Patient describes the pain as being constant and sharp in nature. Patient indicates that the area of the upper right hurts until pain medication is provided.  The patient indicates that it has had a 10 out of 10 intensity but is currently 6/10. Patient indicates that the antibiotic therapy seems to help recently. Patient indicates that he last saw a dentist several years ago for an examination, but did not followup with dental treatment at that time. Patient has no regular primary dentist. Patient knows that he has multiple bad teeth.   PROBLEM LIST: Patient Active Problem List   Diagnosis Date Noted  . 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 Dental abscess 07/03/2013    PMH: History reviewed. No pertinent past medical history.  PSH: History reviewed. No pertinent past surgical history.  ALLERGIES: Allergies  Allergen Reactions  . Penicillins Other (See Comments)    unknown    MEDICATIONS: Current Facility-Administered Medications  Medication Dose Route Frequency Provider Last Rate Last Dose  . acetaminophen (TYLENOL) tablet 650 mg  650 mg Oral Q6H Beverely Low, MD   650 mg at 07/04/13 1142   Or  .  acetaminophen (TYLENOL) suppository 650 mg  650 mg Rectal Q6H Beverely Low, MD      . clindamycin (CLEOCIN) IVPB 600 mg  600 mg Intravenous Q8H Latrelle Dodrill, MD   600 mg at 07/04/13 1020  . heparin injection 5,000 Units  5,000 Units Subcutaneous Q8H Beverely Low, MD   5,000 Units at 07/04/13 1256  . HYDROmorphone (DILAUDID) injection 0.5 mg  0.5 mg Intravenous Q3H PRN Hazeline Junker, MD   0.5 mg at 07/04/13 1254  . [START ON 07/05/2013] influenza vac split quadrivalent PF (FLUARIX) injection 0.5 mL  0.5 mL Intramuscular Tomorrow-1000 Barbaraann Barthel, MD      . Melene Muller ON 07/05/2013] pneumococcal 23 valent vaccine (PNU-IMMUNE) injection 0.5 mL  0.5 mL Intramuscular Tomorrow-1000 Barbaraann Barthel, MD      . senna [facility]) tablet 8.6 mg  1 tablet Oral Daily PRN Beverely Low, MD        LABS: Lab Results  Component Value Date   WBC 10.1 07/03/2013   HGB 15.9 07/03/2013   HCT 45.8 07/03/2013   MCV 90.7 07/03/2013   PLT 246 07/03/2013      Component Value Date/Time   NA 138 07/03/2013 1753   K 3.7 07/03/2013 1753   CL 98 07/03/2013 1753   CO2 28 12/08/2011 1724   GLUCOSE 98 07/03/2013 1753   BUN 6 07/03/2013 1753   CREATININE 1.14 07/03/2013 2325   CALCIUM 9.4 12/08/2011 1724   GFRNONAA 88* 07/03/2013 2325   GFRAA >90 07/03/2013 2325   No results found for  this basename: INR, PROTIME   No results found for this basename: PTT    SOCIAL HISTORY: History   Social History  . Marital Status: Single    Spouse Name: N/A    Number of Children: N/A  . Years of Education: N/A   Occupational History  . Not on file.   Social History Main Topics  . Smoking status: Current Every Day Smoker -- 1.00 packs/day for 10 years    Types: Cigarettes  . Smokeless tobacco: Not on file  . Alcohol Use: 2 - 3 oz/week    4-6 drink(s) per week  . Drug Use: No  . Sexual Activity: Not on file   Other Topics Concern  . Not on file   Social History Narrative  . No narrative on file    FAMILY HISTORY: History  reviewed. No pertinent family history.   REVIEW OF SYSTEMS: Reviewed from chart for this admission.  DENTAL HISTORY: CHIEF COMPLAINT: Patient referred for evaluation of right facial swelling. HPI: Andrew Keller is a 27 year old male referred by family practice for dental consultation. Patient with known right facial swelling and periapical pathology.  A dental consultation was requested to evaluate right facial swelling and provide treatment as indicated for probable dental etiology.  The patient currently is complaining of right facial swelling and pain for the past 2-3 days. Patient describes the pain as being constant and sharp in nature. Patient indicates that the area of the upper right hurts until pain medication is provided.  The patient indicates that it has had a 10 out of 10 intensity but is currently 6/10. Patient indicates that the antibiotic therapy seems to help recently. Patient indicates that he last saw a dentist several years ago for an examination, but did not followup with dental treatment at that time. Patient has no regular primary dentist. Patient knows that he has multiple bad teeth.    DENTAL EXAMINATION:  GENERAL: The patient is a well-developed, well-nourished male in no acute distress. HEAD AND NECK: The patient has bilateral submandibular lymphadenopathy. Patient has right facial swelling. INTRAORAL EXAM: the patient has normal saliva. Patient does appear to have a right buccal abscess in the area of tooth numbers 4 and 5. Patient is able to open approximately 30 mm. DENTITION: patient is missing multiple teeth numbers 3, 8, 14. Patient also has multiple retained root segments. PERIODONTAL: the patient has chronic advanced periodontal disease with plaque and calculus complexes, generalized gingival recession and tooth mobility. DENTAL CARIES/SUBOPTIMAL RESTORATIONS: the patient has multiple dental caries. ENDODONTIC: the patient is complaining of acute  pulpitis symptoms. The patient has multiple areas of apical pathology and radiolucency CROWN AND BRIDGE: no crown restorations noted. PROSTHODONTIC: the patient denies presence of partial dentures OCCLUSION: the patient has a poor occlusal scheme secondary to multiple missing teeth, multiple retained root segments, and lack of replacement of missing teeth with dental prostheses  RADIOGRAPHIC INTERPRETATION:  An orthopantogram was taken on 07/04/2013. There multiple missing teeth. There multiple areas of retained root segments. There multiple areas of periapical pathology and radiolucency. There dental caries noted. There previous root canal therapy associated with tooth numbers 19 and 30. There is moderate bone loss noted. There is radiographic calculus noted.   ASSESSMENTS:  1. Right facial swelling with dental etiology 2. Periapical abscess left buccal swelling associated with tooth numbers 4 and 5 3. Chronic apical periodontitis 4. Chronic periodontitis 5. Accretions 6. Dental caries 7. Multiple missing teeth 8. Multiple retained root segments 9. Malocclusion  10. History of oral neglect   PLAN/RECOMMENDATIONS: 1. I discussed the risks, benefits, and complications of various treatment options with the patient in relationship to  his medical and dental conditions. We discussed various treatment options to include no treatment, multiple extractions with alveoloplasty, pre-prosthetic surgery as indicated, periodontal therapy, dental restorations, root canal therapy, crown and bridge therapy, implant therapy, and replacement of missing teeth as indicated. The patient currently wishes to proceed with  extraction of tooth numbers 4, 5, 7, 12, 18, and 31 in the operating room with general anesthesia. Patient refused extraction of additional teeth including tooth numbers 22 through 26 with significant tooth mobility.  The patient understands that he will need to follow up with a regular dentist for  exam, radiographs, and competence of dental treatment planning to further evaluate his poor dental condition.   2. Discussion of findings with medical team and coordination of future medical and dental care as needed.  Charlynne Pander, DDS

## 2013-07-05 ENCOUNTER — Encounter (HOSPITAL_COMMUNITY)
Admission: EM | Disposition: A | Discharge status: Home / Self Care | Payer: Self-pay | Source: Home / Self Care | Attending: Family Medicine

## 2013-07-05 ENCOUNTER — Encounter (HOSPITAL_COMMUNITY): Payer: Self-pay | Admitting: Certified Registered"

## 2013-07-05 ENCOUNTER — Inpatient Hospital Stay (HOSPITAL_COMMUNITY): Payer: Self-pay | Admitting: Certified Registered"

## 2013-07-05 DIAGNOSIS — K083 Retained dental root: Secondary | ICD-10-CM | POA: Diagnosis present

## 2013-07-05 DIAGNOSIS — R51 Headache: Secondary | ICD-10-CM

## 2013-07-05 DIAGNOSIS — R220 Localized swelling, mass and lump, head: Secondary | ICD-10-CM

## 2013-07-05 DIAGNOSIS — K047 Periapical abscess without sinus: Secondary | ICD-10-CM

## 2013-07-05 DIAGNOSIS — K045 Chronic apical periodontitis: Secondary | ICD-10-CM | POA: Diagnosis present

## 2013-07-05 HISTORY — PX: MULTIPLE EXTRACTIONS WITH ALVEOLOPLASTY: SHX5342

## 2013-07-05 LAB — HIV ANTIBODY (ROUTINE TESTING W REFLEX): HIV: NONREACTIVE

## 2013-07-05 LAB — SURGICAL PCR SCREEN: Staphylococcus aureus: NEGATIVE

## 2013-07-05 SURGERY — MULTIPLE EXTRACTION WITH ALVEOLOPLASTY
Anesthesia: General | Wound class: Dirty or Infected

## 2013-07-05 MED ORDER — FENTANYL CITRATE 0.05 MG/ML IJ SOLN
INTRAMUSCULAR | Status: DC | PRN
  Administered 2013-07-05: 100 ug via INTRAVENOUS
  Administered 2013-07-05: 50 ug via INTRAVENOUS
  Administered 2013-07-05: 100 ug via INTRAVENOUS

## 2013-07-05 MED ORDER — CLINDAMYCIN HCL 150 MG PO CAPS: 450.0000 mg | ORAL_CAPSULE | Freq: Four times a day (QID) | ORAL | Status: DC

## 2013-07-05 MED ORDER — HEMOSTATIC AGENTS (NO CHARGE) OPTIME
TOPICAL | Status: DC | PRN
  Administered 2013-07-05: 1 via TOPICAL

## 2013-07-05 MED ORDER — BUPIVACAINE-EPINEPHRINE 0.5% -1:200000 IJ SOLN
INTRAMUSCULAR | Status: DC | PRN
  Administered 2013-07-05: 3.6 mL

## 2013-07-05 MED ORDER — ARTIFICIAL TEARS OP OINT
TOPICAL_OINTMENT | OPHTHALMIC | Status: DC | PRN
  Administered 2013-07-05: 1 via OPHTHALMIC

## 2013-07-05 MED ORDER — 0.9 % SODIUM CHLORIDE (POUR BTL) OPTIME
TOPICAL | Status: DC | PRN
  Administered 2013-07-05: 1000 mL

## 2013-07-05 MED ORDER — OXYCODONE-ACETAMINOPHEN 5-325 MG PO TABS: 1.0000 | ORAL_TABLET | ORAL | Status: DC | PRN

## 2013-07-05 MED ORDER — PROPOFOL 10 MG/ML IV BOLUS
INTRAVENOUS | Status: DC | PRN
  Administered 2013-07-05: 200 mg via INTRAVENOUS

## 2013-07-05 MED ORDER — CLINDAMYCIN HCL 300 MG PO CAPS
450.0000 mg | ORAL_CAPSULE | Freq: Four times a day (QID) | ORAL | Status: DC
  Administered 2013-07-05 – 2013-07-06 (×4): 450 mg via ORAL
  Filled 2013-07-05 (×10): qty 1

## 2013-07-05 MED ORDER — LIDOCAINE-EPINEPHRINE 2 %-1:100000 IJ SOLN
INTRAMUSCULAR | Status: AC
  Filled 2013-07-05: qty 10.2

## 2013-07-05 MED ORDER — SUCCINYLCHOLINE CHLORIDE 20 MG/ML IJ SOLN
INTRAMUSCULAR | Status: DC | PRN
  Administered 2013-07-05: 120 mg via INTRAVENOUS

## 2013-07-05 MED ORDER — LACTATED RINGERS IV SOLN
INTRAVENOUS | Status: DC
  Administered 2013-07-05: 09:00:00 via INTRAVENOUS

## 2013-07-05 MED ORDER — LIDOCAINE HCL (CARDIAC) 20 MG/ML IV SOLN
INTRAVENOUS | Status: DC | PRN
  Administered 2013-07-05: 50 mg via INTRAVENOUS

## 2013-07-05 MED ORDER — LACTATED RINGERS IV SOLN
INTRAVENOUS | Status: DC | PRN
  Administered 2013-07-05: 09:00:00 via INTRAVENOUS

## 2013-07-05 MED ORDER — LACTATED RINGERS IV SOLN
INTRAVENOUS | Status: DC
  Administered 2013-07-05: 20 mL/h via INTRAVENOUS

## 2013-07-05 MED ORDER — OXYCODONE-ACETAMINOPHEN 5-325 MG PO TABS
1.0000 | ORAL_TABLET | ORAL | Status: DC | PRN
  Administered 2013-07-05 – 2013-07-06 (×5): 2 via ORAL
  Filled 2013-07-05 (×6): qty 2

## 2013-07-05 MED ORDER — BUPIVACAINE-EPINEPHRINE PF 0.5-1:200000 % IJ SOLN
INTRAMUSCULAR | Status: AC
  Filled 2013-07-05: qty 10.8

## 2013-07-05 MED ORDER — MIDAZOLAM HCL 5 MG/5ML IJ SOLN
INTRAMUSCULAR | Status: DC | PRN
  Administered 2013-07-05 (×2): 1 mg via INTRAVENOUS

## 2013-07-05 MED ORDER — HYDROMORPHONE HCL PF 1 MG/ML IJ SOLN: 0.2500 mg | INTRAMUSCULAR | Status: DC | PRN

## 2013-07-05 MED ORDER — ONDANSETRON HCL 4 MG/2ML IJ SOLN
INTRAMUSCULAR | Status: DC | PRN
  Administered 2013-07-05: 4 mg via INTRAMUSCULAR

## 2013-07-05 MED ORDER — ONDANSETRON HCL 4 MG/2ML IJ SOLN: 4.0000 mg | Freq: Once | INTRAMUSCULAR | Status: DC | PRN

## 2013-07-05 MED ORDER — PHENYLEPHRINE HCL 10 MG/ML IJ SOLN
INTRAMUSCULAR | Status: DC | PRN
  Administered 2013-07-05: 40 ug via INTRAVENOUS

## 2013-07-05 MED ORDER — LIDOCAINE-EPINEPHRINE 2 %-1:100000 IJ SOLN
INTRAMUSCULAR | Status: DC | PRN
  Administered 2013-07-05: 6.8 mL

## 2013-07-05 SURGICAL SUPPLY — 38 items
ALCOHOL 70% 16 OZ (MISCELLANEOUS) ×2 IMPLANT
ATTRACTOMAT 16X20 MAGNETIC DRP (DRAPES) ×2 IMPLANT
BLADE SURG 15 STRL LF DISP TIS (BLADE) ×2 IMPLANT
BLADE SURG 15 STRL SS (BLADE) ×4
CLOTH BEACON ORANGE TIMEOUT ST (SAFETY) IMPLANT
COVER SURGICAL LIGHT HANDLE (MISCELLANEOUS) ×2 IMPLANT
CRADLE DONUT ADULT HEAD (MISCELLANEOUS) ×2 IMPLANT
GAUZE PACKING FOLDED 2  STR (GAUZE/BANDAGES/DRESSINGS) ×1
GAUZE PACKING FOLDED 2 STR (GAUZE/BANDAGES/DRESSINGS) ×1 IMPLANT
GAUZE SPONGE 4X4 16PLY XRAY LF (GAUZE/BANDAGES/DRESSINGS) ×2 IMPLANT
GLOVE BIOGEL PI IND STRL 6 (GLOVE) IMPLANT
GLOVE BIOGEL PI INDICATOR 6 (GLOVE) ×1
GLOVE SURG ORTHO 8.0 STRL STRW (GLOVE) ×2 IMPLANT
GLOVE SURG SS PI 6.0 STRL IVOR (GLOVE) ×2 IMPLANT
GOWN STRL REIN 3XL LVL4 (GOWN DISPOSABLE) ×2 IMPLANT
HEMOSTAT SURGICEL .5X2 ABSORB (HEMOSTASIS) IMPLANT
KIT BASIN OR (CUSTOM PROCEDURE TRAY) ×2 IMPLANT
KIT ROOM TURNOVER OR (KITS) ×2 IMPLANT
MANIFOLD NEPTUNE WASTE (CANNULA) ×2 IMPLANT
NDL BLUNT 16X1.5 OR ONLY (NEEDLE) ×1 IMPLANT
NDL DENTAL 27 LONG (NEEDLE) IMPLANT
NEEDLE BLUNT 16X1.5 OR ONLY (NEEDLE) ×2 IMPLANT
NEEDLE DENTAL 27 LONG (NEEDLE) IMPLANT
NS IRRIG 1000ML POUR BTL (IV SOLUTION) ×2 IMPLANT
PACK EENT II TURBAN DRAPE (CUSTOM PROCEDURE TRAY) ×2 IMPLANT
PAD ARMBOARD 7.5X6 YLW CONV (MISCELLANEOUS) ×3 IMPLANT
SPONGE SURGIFOAM ABS GEL 100 (HEMOSTASIS) ×2 IMPLANT
SPONGE SURGIFOAM ABS GEL 12-7 (HEMOSTASIS) IMPLANT
SPONGE SURGIFOAM ABS GEL SZ50 (HEMOSTASIS) IMPLANT
SUCTION FRAZIER TIP 10 FR DISP (SUCTIONS) IMPLANT
SUT CHROMIC 3 0 PS 2 (SUTURE) ×4 IMPLANT
SUT CHROMIC 4 0 P 3 18 (SUTURE) IMPLANT
SYR 50ML SLIP (SYRINGE) ×2 IMPLANT
TOWEL OR 17X24 6PK STRL BLUE (TOWEL DISPOSABLE) ×2 IMPLANT
TOWEL OR 17X26 10 PK STRL BLUE (TOWEL DISPOSABLE) ×2 IMPLANT
TUBE CONNECTING 12X1/4 (SUCTIONS) ×2 IMPLANT
WATER STERILE IRR 1000ML POUR (IV SOLUTION) ×2 IMPLANT
YANKAUER SUCT BULB TIP NO VENT (SUCTIONS) ×2 IMPLANT

## 2013-07-05 NOTE — Anesthesia Postprocedure Evaluation (Signed)
  Anesthesia Post-op Note  Patient: Andrew Keller  Procedure(s) Performed: Procedure(s): MULTIPLE EXTRACTION OF TOOTH #'S [phone] WITH ALVEOLOPLASTY A ND INCISION AND DRAINAGE OF THE MAXILLARY RIGHT BUCCAL VESTIBULE AREA 4-5. (N/A)  Patient Location: PACU  Anesthesia Type:General  Level of Consciousness: awake, alert  and oriented  Airway and Oxygen Therapy: Patient Spontanous Breathing  Post-op Pain: mild  Post-op Assessment: Post-op Vital signs reviewed, Patient's Cardiovascular Status Stable, Respiratory Function Stable, Patent Airway and Pain level controlled  Post-op Vital Signs: stable  Complications: No apparent anesthesia complications

## 2013-07-05 NOTE — Anesthesia Preprocedure Evaluation (Addendum)
Anesthesia Evaluation  Patient identified by MRN, date of birth, ID band Patient awake    Reviewed: Allergy & Precautions, H&P , NPO status , Patient's Chart, lab work & pertinent test results, reviewed documented beta blocker date and time   Airway Mallampati: II TM Distance: >3 FB   Mouth opening: Limited Mouth Opening  Dental  (+) Poor Dentition, Dental Advisory Given and Loose   Pulmonary  breath sounds clear to auscultation        Cardiovascular Rhythm:Regular Rate:Normal     Neuro/Psych    GI/Hepatic   Endo/Other    Renal/GU      Musculoskeletal   Abdominal   Peds  Hematology   Anesthesia Other Findings Lower right front X1 is loose. Assessment done at 0945  Reproductive/Obstetrics                         Anesthesia Physical Anesthesia Plan  ASA: II  Anesthesia Plan: General   Post-op Pain Management:    Induction: Intravenous  Airway Management Planned: Oral ETT  Additional Equipment:   Intra-op Plan:   Post-operative Plan: Extubation in OR  Informed Consent: I have reviewed the patients History and Physical, chart, labs and discussed the procedure including the risks, benefits and alternatives for the proposed anesthesia with the patient or authorized representative who has indicated his/her understanding and acceptance.     Plan Discussed with: CRNA and Anesthesiologist  Anesthesia Plan Comments: (Smoker Poor dentition with L. Facial swelling and mild trismus  Plan GA with oral ETT   Kipp Brood, MD)        Anesthesia Quick Evaluation

## 2013-07-05 NOTE — Anesthesia Procedure Notes (Signed)
Procedure Name: Intubation Date/Time: 07/05/2013 10:09 AM Performed by: Darcey Nora B Pre-anesthesia Checklist: Patient identified, Patient being monitored, Emergency Drugs available and Suction available Patient Re-evaluated:Patient Re-evaluated prior to inductionOxygen Delivery Method: Circle system utilized Preoxygenation: Pre-oxygenation with 100% oxygen Intubation Type: IV induction Ventilation: Mask ventilation without difficulty Laryngoscope Size: Mac and 4 Grade View: Grade II Tube type: Oral Tube size: 7.5 mm Number of attempts: 1 Airway Equipment and Method: Stylet Placement Confirmation: ETT inserted through vocal cords under direct vision,  breath sounds checked- equal and bilateral and positive ETCO2 Secured at: 23 (cm at lip) cm Tube secured with: Tape Dental Injury: Teeth and Oropharynx as per pre-operative assessment

## 2013-07-05 NOTE — Progress Notes (Signed)
FMTS Attending Note  I personally saw and evaluated the patient. The plan of care was discussed with the resident team. I agree with the assessment and plan as documented by the resident.   Patient seen post-op, he complains of pain, no other acute issues identifed  1. Maxillary osteomyelitis/dental abscesses - has tooth extractions performed today, continue Clindamycin, discuss with Dental/ID length of antibiotics given osteomyelitis 2. Pain control - continue Dilauded PRN  Donnella Sham MD

## 2013-07-05 NOTE — Progress Notes (Signed)
Family Medicine Teaching Service Daily Progress Note Intern Pager: [phone]  Patient name: Andrew Keller Medical record number: [id number] Date of birth: [birth year] Age: 27 y.o. Gender: male  Primary Care Provider: Default, Provider, MD Consultants: oral surgery Code Status: full  Pt Overview and Major Events to Date:  10/8 - Admitted with dental abscess and osteo of maxilla 10/10 - OR with Kulinski for tooth extractions  Assessment and Plan: Andrew Keller is a 27 y.o. male presenting with facial swelling found to have dental abscess and maxillary osteomyelitis on CT . PMH is significant for past dental problems and no recent dental care. Currently protecting airway and in NAD, no meningeal signs.   # Dental abscess, maxillary osteo: normal WBC, no documented fevers  - IV clinda 600mg  TID  - dental c/s'ed Kristin Bruins) - to OR for 6 extractions today - f/u blood cultures  - pain control with IV dilaudid 0.5mg  q3h  - ID rec clindamycin 450 qid for 2-3 months and follow ESR  # Tobacco abuse:  - will place order for RN to provide smoking cessation materials   FEN/GI: NPO for OR today, advance as tolerated when he returns, saline lock IV  Prophylaxis: SQ heparin  Disposition: floor pending clinical improvement  Subjective: Feeling better this morning, pain well controlled. Nervous about OR but girlfriend providing reassurance.  Objective: Temp:  [98 F (36.7 C)-99 F (37.2 C)] 98.6 F (37 C) (10/10 0859) Pulse Rate:  [57-84] 82 (10/10 0859) Resp:  [18-20] 18 (10/10 0859) BP: (117-141)/(67-83) 141/83 mmHg (10/10 0859) SpO2:  [100 %] 100 % (10/10 0859) Physical Exam: General: sitting up in bed in NAD  HEENT: MMM, EOMI, neck supple w/ normal rom, poor dentition, marked R-sided facial swelling, swelling around R upper and lower molars, not able to completely open mouth secondary to pain and swelling  Cardiovascular: RRR, no MRG, 2+ dp pulses  Respiratory: CTAB, normal  wob  Abdomen: soft, nontender, nondistended Extremities: no LE edema  Skin: no rashes or lesions  Neuro: alert, oriented, no focal deficits   Laboratory:  Recent Labs Lab 07/03/13 1706 07/03/13 1753 07/03/13 2325  WBC 9.7  --  10.1  HGB 16.2 17.7* 15.9  HCT 46.2 52.0 45.8  PLT 259  --  246    Recent Labs Lab 07/03/13 1519 07/03/13 1753 07/03/13 2325  NA  --  138  --   K  --  3.7  --   CL  --  98  --   BUN  --  6  --   CREATININE 1.30 1.20 1.14  GLUCOSE  --  98  --    BCx NGTD HIV neg RPR neg UDS pos for opiates only  Imaging/Diagnostic Tests: CT maxillofacial: There is a 1.2 cm lytic bone lesion and osteomyelitis with cortical breakthrough in the right maxilla. This is associated with a 1.4 x 1.9 cm abscess of the right upper bicuspid. Several areas of dental caries and periapical abscess involving lower teeth are also noted.  Orthopantogram: Periapical lucency involving the right lower 2nd molar. Additional periapical lucency involving the left lower 2nd molar. Associated  dental caries. Prior root canal involving the lower 1st molar bilaterally. Multiple additional dental caries and missing teeth.  Beverely Low, MD 07/05/2013, 9:11 AM PGY-1, [facility] Family Medicine FPTS Intern pager: [phone], text pages welcome

## 2013-07-05 NOTE — Preoperative (Signed)
Beta Blockers   Reason not to administer Beta Blockers:Not Applicable 

## 2013-07-05 NOTE — Op Note (Signed)
Patient:            Andrew Keller Date of Birth:  [birth year] MRN:                [id number]   DATE OF PROCEDURE:  07/05/2013               OPERATIVE REPORT   PREOPERATIVE DIAGNOSES: 1. Right facial swelling 2. maxillary right buccal abscess 3. chronic apical periodontitis 4. multiple retained root segments 5. chronic periodontitis 6. loose teeth  POSTOPERATIVE DIAGNOSES: 1. Right facial swelling 2. maxillary right buccal abscess 3. chronic apical periodontitis 4. multiple retained root segments 5. chronic periodontitis 6. loose teeth  OPERATIONS: 1. Multiple extraction of tooth numbers 4, 5, 7, 12, 18, 31 with alveoloplasty 2. incision and drainage of the maxillary right buccal abscess   SURGEON: Charlynne Pander, DDS  ASSISTANT: Rory Percy, (dental assistant)  ANESTHESIA: General anesthesia via oral endotracheal tube.  MEDICATIONS: 1. clindamycin IV antibiotic therapy per previous orders . 2. Local anesthesia with a total utilization of 4 carpules each containing 34 mg of lidocaine with 0.017 mg of epinephrine as well as 2 carpules each containing 9 mg of bupivacaine with 0.009 mg of epinephrine.  SPECIMENS: There were 6 teeth that were discarded.  DRAINS: None  CULTURES: None  COMPLICATIONS: None   ESTIMATED BLOOD LOSS: 50 mLs.  INTRAVENOUS FLUIDS: 900 mLs of Lactated ringers solution.  INDICATIONS: The patient was recently diagnosed with right facial swelling.  A dental consultation was then requested to evaluate patient for dental etiology and to provide treatment as indicated.  The patient was examined and treatment planned for multiple extractions with alveoloplasty and incision and drainage as indicated.  This treatment plan was formulated to decrease the risks and complications associated with dental infection from further affecting the patient's systemic health.  OPERATIVE FINDINGS: Patient was examined operating room number 10.  The teeth  were identified for extraction. The patient agreed to extraction of tooth numbers 4, 5, 7, 12, 18, and 31 but refused extraction of other teeth especially the mandibular anterior teeth with significant tooth mobility. The patient was noted be affected by right facial swelling, maxillary right buccal abscess in the area of tooth numbers 4 and 5 , multiple retained root segments, dental caries, chronic apical periodontitis, multiple mobile teeth, and chronic periodontitis.   DESCRIPTION OF PROCEDURE: Patient was brought to the main operating room number 10. Patient was then placed in the supine position on the operating table. General anesthesia was then induced per the anesthesia team. The patient was then prepped and draped in the usual manner for dental medicine procedure. A timeout was performed. The patient was identified and procedures were verified. A throat pack was placed at this time. The oral cavity was then thoroughly examined with the findings noted above. The patient was then ready for dental medicine procedure as follows:  Local anesthesia was then administered sequentially with a total utilization of 4 carpules each containing 34 mg of lidocaine with 0.017 mg of epinephrine as well as 2 carpules  each containing 9 mg bupivacaine with 0.009 mg of epinephrine.  The Maxillary left and right quadrants first approached. Anesthesia was then delivered utilizing infiltration with lidocaine with epinephrine. The maxillary left quadrant was first approached. The retained roots in the area tooth #12 removed with a rongeur. The tooth socket was then curetted and compressed appropriately. Minor alveoloplasty was performed as needed. The surgical site was then irrigated with copious of  sterile saline.  A piece of Surgifoam was then placed in the extraction socket appropriately. The surgical site was then closed from the mesial of #12 and extended to the distal of #11 utilizing 3-0 chromic gut suture material  in a continuous interrupted suture technique x1.   A this point time tooth #7 was approached. A #15 blade incision was then made from the mesial #9 and extended to the mesial numbers 6.  A  surgical flap was then carefully reflected. Appropriate amounts of buccal and interseptal bone were then removed around retained root #7. The coronal part of thetooth was then removed with a 150 forceps leaving the root remaining. Further bone was then removed around retained root and this was then removed with a 150 forceps without further complication. Alveoloplasty was performed utilizing a rongeur and bone file. The surgical site was then irrigated with copious amounts sterile saline. A piece of Surgifoam was then placed in the extraction socket appropriately. The surgical site was then closed from the mesial #9 and extended the mesial numbers 6 utilizing 3-0 chromic gut suture material in a continuous interrupted suture technique x1   The maxillary right surgical site was then approached. At this point time significant buccal swelling was appreciated. A 15 blade incision was then made and the buccal vestibule in area of tooth numbers 4-5 measuring approximately 1 cm. Approximately 20 mLs of purulent exudate was then expressed from the buccal vestibule.  A 15 blade incision was then made from the mesial of #2 and extended to the distal of #6 . Surgical flap was then carefully reflected. Appropriate bone was removed as needed. The retained roots in the area of tooth numbers 4 and 5 were then elevated out with a series of cryers elevators. Alveoloplasty was performed utilizing a Rongeurs and bone file. The granulation tissue was then removed carefully from the surgical site. At this point time the surgical site and previous incision drainage site was irrigated with approximately 200 mls of sterile saline. The surgical site was then closed from the mesial #2 and extended to the distal of #6 utilizing 3-0 chromic gut suture  in a continuous interrupted suture technique x1.  The area of the incision drainage was not closed at this time nor was a drain placed.   At this point time, the mandibular quadrants were approached. The patient was given bilateral inferior alveolar nerve blocks and long buccal nerve blocks utilizing the bupivacaine with epinephrine. Further infiltration was then achieved utilizing the lidocaine with epinephrine. The mandibular left quadrant was then approached. A 15 blade incision was then made from the mesial #17 and extended the mesial #19.  A surgical flap was then carefully reflected. Appropriate amounts of buccal and interseptal bone were then removed utilizing a surgical handpiece and copious amount of sterile water around retained roots numbers 18. The mesial distal root of #18 were then elevated out with a series of cryers elevators without further complications. Alveoloplasty was performed utilizing a rongeur and bone file. The surgical site was irrigated with copious amounts sterile saline. The tissues were approximated and trimmed appropriately. The surgical site was then closed from the mesial #17 and extended to the distal of #19 utilizing 3-0 chromic gut. A continuous interrupted suture technique x1.   At this point time the oral endotracheal tube was moved by the anesthesia team from the right side of the mouth to the left side to gain access to tooth #31. This was achieved without complication. Tooth #31  was approached. A 15 blade incision was then made from the mesial of #32 and extended to the mesial of #30.  A surgical flap was then carefully reflected. Appropriate amounts of buccal and interseptal bone was removed with a surgical handpiece and bur and copious amounts sterile water. The retained roots in the area tooth #31 were then elevated out with a series of cryers elevators without further complication. Alveoloplasty was carefully performed utilizing a rongeur and bone file. The tissues  were approximated and trimmed appropriately. The surgical site was irrigated with copious postural saline. The surgical site was then closed from the mesial #32 and extended to the distal of #30 utilizing 3-0 chromic gut suture in a continuous interrupted suture technique x1.   At this point time, the entire mouth was irrigated with copious amounts of sterile saline. The patient was examined for complications, seeing none, the dental medicine procedure was deemed to be complete. The throat pack was removed at this time. A series of 4 x 4 gauze were placed in the mouth to aid hemostasis. The patient was then handed over to the anesthesia team for final disposition. After an appropriate amount of time, the patient was extubated and taken to the postanesthsia care unit with stable vital signs and a good condition. All counts were correct for the dental medicine procedure.  The patient will be discharged from this inpatient admission at the discretion of the family practice medical team. Additional IV or oral antibiotic therapy will be prescribed as indicated. Patient also be given appropriate pain medication. Patient will then followup with dental medicine in approximately one week on Monday, 07/15/2013. Patient is to call for an appointment at [phone] per discharge planning instructions.   Charlynne Pander, DDS.

## 2013-07-05 NOTE — Progress Notes (Signed)
PRE-OPERATIVE NOTE:  07/05/2013 KEENE GILKEY [id number]  VITALS: BP 141/83  Pulse 82  Temp(Src) 98.6 F (37 C) (Oral)  Resp 18  Ht 5\' 11"  (1.803 m)  Wt 202 lb 9.6 oz (91.9 kg)  BMI 28.27 kg/m2  SpO2 100%  Lab Results  Component Value Date   WBC 10.1 07/03/2013   HGB 15.9 07/03/2013   HCT 45.8 07/03/2013   MCV 90.7 07/03/2013   PLT 246 07/03/2013   BMET    Component Value Date/Time   NA 138 07/03/2013 1753   K 3.7 07/03/2013 1753   CL 98 07/03/2013 1753   CO2 28 12/08/2011 1724   GLUCOSE 98 07/03/2013 1753   BUN 6 07/03/2013 1753   CREATININE 1.14 07/03/2013 2325   CALCIUM 9.4 12/08/2011 1724   GFRNONAA 88* 07/03/2013 2325   GFRAA >90 07/03/2013 2325    No results found for this basename: INR, PROTIME   No results found for this basename: PTT     Daleen Squibb presents for multiple extractions with alveoloplasty in the operating room with general anesthesia.  We again discussed the teeth the patient was to have extracted at this time. The patient agrees to extraction of tooth #'s 4,5,7,12,18 and 31 ONLY. Patient refuses to have lower anterior teeth extracted even though they are very loose.   SUBJECTIVE: The patient denies any acute medical or dental changes and agrees to proceed with treatment as discussed above.  EXAM: No sign of acute dental changes.  ASSESSMENT: Patient is affected by right facial swelling, chronic apical periodontitis, history of periapical abscess, multiple retained root segments, chronic periodontitis, multiple mobile teeth.  PLAN: Patient agrees to proceed with treatment as planned in the operating room as previously discussed and accepts the risks, benefits, complications of the proposed treatment.  Charlynne Pander, DDS

## 2013-07-05 NOTE — Transfer of Care (Signed)
Immediate Anesthesia Transfer of Care Note  Patient: Andrew Keller  Procedure(s) Performed: Procedure(s): MULTIPLE EXTRACTION OF TOOTH #'S [phone] WITH ALVEOLOPLASTY A ND INCISION AND DRAINAGE OF THE MAXILLARY RIGHT BUCCAL VESTIBULE AREA 4-5. (N/A)  Patient Location: PACU  Anesthesia Type:General  Level of Consciousness: awake, alert , oriented and patient cooperative  Airway & Oxygen Therapy: Patient Spontanous Breathing and Patient connected to nasal cannula oxygen  Post-op Assessment: Report given to PACU RN, Post -op Vital signs reviewed and stable and Patient moving all extremities  Post vital signs: Reviewed and stable  Complications: No apparent anesthesia complications

## 2013-07-06 LAB — CBC
HCT: 43.1 % (ref 39.0–52.0)
MCH: 32.6 pg (ref 26.0–34.0)
MCV: 91.1 fL (ref 78.0–100.0)
Platelets: 229 10*3/uL (ref 150–400)
RDW: 13.1 % (ref 11.5–15.5)
WBC: 6.5 10*3/uL (ref 4.0–10.5)

## 2013-07-06 MED ORDER — OXYCODONE-ACETAMINOPHEN 5-325 MG PO TABS: 1.0000 | ORAL_TABLET | ORAL | Status: DC | PRN

## 2013-07-06 MED ORDER — CLINDAMYCIN HCL 150 MG PO CAPS: 450.0000 mg | ORAL_CAPSULE | Freq: Four times a day (QID) | ORAL | Status: DC

## 2013-07-06 MED ORDER — SACCHAROMYCES BOULARDII 250 MG PO CAPS: 250.0000 mg | ORAL_CAPSULE | Freq: Two times a day (BID) | ORAL | Status: DC

## 2013-07-06 NOTE — Progress Notes (Signed)
Family Medicine Teaching Service Daily Progress Note Intern Pager: [phone]  Patient name: Andrew Keller Medical record number: [id number] Date of birth: 22-Nov-1985 Age: 27 y.o. Gender: male  Primary Care Provider: Default, Provider, MD Consultants: Dentist Code Status: FULL  Pt Overview and Major Events to Date:  Assessment and Plan: Andrew Keller is a 27 y.o. 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 PMH is significant for past dental problems and no recent dental care. Now s/p oral surgery with 6 extractions, doing well.  # Dental abscess, maxillary osteo - normal CBC on admission, no documented fever - S/p IV clindamycin 600mg  TID, transitioned to 450mg  QID by ID with recommendation to continue 2-3 months and follow ESR in clinic to determine stop date - Start probiotic - Dental Kristin [place]) performed extraction of 6 teeth in OR yesterday, procedure went well - F/u blood cultures, NGTD pending final read - Pain controlled with percocet and requesting diet advancement; d/c with percocet - Per dental d/c instructions, pt may eat softs as tolerated, nothing hot and no sucking through straw - Discharge today  # Tobacco use - RN to provide smoking cessation per order  # FEN/GI: Advanced to soft per nursing request and dental discharge instructions stating okay to eat soft foods; in order comments - no hot foods, no sucking through straw PPx: SCDs s/p surgery  Disposition: Today - Has dental f/u 07/15/13 - per d/c instructions, pt to call for appt - SW has already provided list of PCPs  Subjective: Pt with some pain but otherwise doing well, tolerating soft diet, wants to go home  Objective: Temp:  [97.4 F (36.3 C)-98.6 F (37 C)] 98.3 F (36.8 C) (10/11 0553) Pulse Rate:  [73-115] 74 (10/11 0553) Resp:  [13-20] 18 (10/11 0553) BP: (105-162)/(57-95) 111/62 mmHg (10/11 0553) SpO2:  [94 %-100 %] 99 % (10/11  0553) Physical Exam: General: NAD, pleasant Cardiovascular: RRR, no m/r/g Respiratory: CTAB, no wheezes or crackles Abdomen: soft, nontender Extremities: No cyanosis edema or deformity HEENT: o/p clear, poor dentition s/p extraction of 6 teeth, no exudate or bleeding, able to open mouth completely  Laboratory:  Recent Labs Lab 07/03/13 1706 07/03/13 1753 07/03/13 2325  WBC 9.7  --  10.1  HGB 16.2 17.7* 15.9  HCT 46.2 52.0 45.8  PLT 259  --  246    Recent Labs Lab 07/03/13 1519 07/03/13 1753 07/03/13 2325  NA  --  138  --   K  --  3.7  --   CL  --  98  --   BUN  --  6  --   CREATININE 1.30 1.20 1.14  GLUCOSE  --  98  --       Leona Singleton, MD 07/06/2013, 9:14 AM PGY-2,  Family Medicine FPTS Intern pager: [phone], text pages welcome

## 2013-07-06 NOTE — Progress Notes (Signed)
FMTS Attending Note  I personally saw and evaluated the patient. The plan of care was discussed with the resident team. I agree with the assessment and plan as documented by the resident.   Benaiah Behan MD 

## 2013-07-06 NOTE — Discharge Summary (Signed)
FMTS Attending Note  I personally saw and evaluated the patient. The plan of care was discussed with the resident team. I agree with the assessment and plan as documented by the resident.   Patient to complete 2-3 month course of Clindamycin Per ID recs. He will need close outpatient follow up with Dentistry. List of potential PCP's provided.   Donnella Sham MD

## 2013-07-06 NOTE — Discharge Summary (Signed)
Family Medicine Teaching [facility] Discharge Summary  Patient name: Andrew Keller Medical record number: [id number] Date of birth: [birth year] Age: 27 y.o. Gender: male Date of Admission: 07/03/2013  Date of Discharge: 07/06/2013  Admitting Physician: Barbaraann Barthel, MD  Primary Care Provider: Default, Provider, MD Consultants: Dentist, ID  Indication for Hospitalization: Dental abscess and maxillary osteomyelitis  Discharge Diagnoses/Problem List:  Dental abscess Maxillary osteomyelitis Tobacco use No PCP  Disposition: Home  Discharge Condition: Stable, improved  Discharge Exam: See my progress note dated 07/06/2013   Brief Hospital Course: Andrew Keller is a 27 y.o. 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 PMH is significant for past dental problems and no recent dental care. Now s/p oral surgery with 6 extractions 07/05/13.  # Dental abscess with maxillary osteomyelitis - Normal CBC on admission with no documented fever. CT maxillofacial and orthopantogram showed abscesses and multiple caries and osteomyelitis (see below). IV clindamycin 600mg  TID started, transitioned to 450mg  PO QID by ID with recommendation to continue x 2-3 months and follow ESR in clinic to determine stop date (current ESR 15). HIV/RPR nonreactive, UDS positive only for opiates. Dental Kristin Bruins) performed extraction of 6 teeth in OR 10/10, procedure went well. Blood cultures drawn and NGTD pending final read. Pain was controlled with percocet and pt tolerated liquid diet with recommendation that he could go home and eat soft foods with no hot foods and no sucking through straw. Instructed to take clindamycin, probiotic florastor, and f/u with PCP (set up with PCP and given appt) and dentist.  # Tobacco use - Rn provided smoking cessation per order.  # Labile blood pressures - Stable but ranged systolic 105-162 day of discharge, no chest  pain or shortness of breath, no PCP and no known PMH. Recommended f/u with PCP.  Issues for Follow Up:  - F/u ESR to determine stop date of clindamycin - F/u final read for blood cultures collected in hospital - Pt should establish with dentist  Significant Procedures: Dental extraction 6 teeth 07/05/13. See Op Note dated 07/05/13 by Charlynne Pander, DDS.  Significant Labs and Imaging:   Recent Labs Lab 07/03/13 1706 07/03/13 1753 07/03/13 2325 07/06/13 0855  WBC 9.7  --  10.1 6.5  HGB 16.2 17.7* 15.9 15.4  HCT 46.2 52.0 45.8 43.1  PLT 259  --  246 229    Recent Labs Lab 07/03/13 1519 07/03/13 1753 07/03/13 2325  NA  --  138  --   K  --  3.7  --   CL  --  98  --   GLUCOSE  --  98  --   BUN  --  6  --   CREATININE 1.30 1.20 1.14   CT maxillofacial: There is a 1.2 cm lytic bone lesion and osteomyelitis with cortical breakthrough in the right maxilla. This is associated with a 1.4 x 1.9 cm abscess of the right upper bicuspid. Several areas of dental caries and periapical abscess involving lower teeth are also noted.   Orthopantogram: Periapical lucency involving the right lower 2nd molar. Additional periapical lucency involving the left lower 2nd molar. Associated  dental caries. Prior root canal involving the lower 1st molar bilaterally. Multiple additional dental caries and missing teeth.   Results/Tests Pending at Time of Discharge: Blood culture final read  Discharge Medications:    Medication List         clindamycin 150 MG capsule  Commonly known  as:  CLEOCIN  Take 3 capsules (450 mg total) by mouth 4 (four) times daily.     oxyCODONE-acetaminophen 5-325 MG per tablet  Commonly known as:  PERCOCET/ROXICET  Take 1-2 tablets by mouth every 4 (four) hours as needed.     saccharomyces boulardii 250 MG capsule  Commonly known as:  FLORASTOR  Take 1 capsule (250 mg total) by mouth 2 (two) times daily.        Discharge Instructions: Please refer to Patient  Instructions section of EMR for full details.  Patient was counseled important signs and symptoms that should prompt return to medical care, changes in medications, dietary instructions, activity restrictions, and follow up appointments.   Follow-Up Appointments: Follow-up Information   Follow up with Charlynne Pander, DDS. Schedule an appointment as soon as possible for a visit on 07/15/2013.   Specialty:  Dentistry   Contact information:   [address] [place] [address] [phone]       Follow up with [facility] On 07/18/2013. (11am)    Contact information:   [address] [place] [address] [phone]      Leona Singleton, MD 07/06/2013, 1:06 PM PGY-2, [facility] Family Medicine Service Pager [phone], text pages welcome through [email or web address]

## 2013-07-06 NOTE — Progress Notes (Signed)
Pt discharged to home accomp by family.  Rx given and explained to pt for Cleocin, percocet and florastor.  Pt understands the importance of taking all of the antibx.  Reviewed gentle rinsing and given supplies.  All instructions given.

## 2013-07-09 ENCOUNTER — Encounter (HOSPITAL_COMMUNITY): Payer: Self-pay | Admitting: Dentistry

## 2013-07-09 LAB — CULTURE, BLOOD (ROUTINE X 2)
Culture: NO GROWTH
Culture: NO GROWTH

## 2013-07-15 ENCOUNTER — Ambulatory Visit (HOSPITAL_COMMUNITY): Payer: Self-pay | Admitting: Dentistry

## 2013-07-18 ENCOUNTER — Inpatient Hospital Stay: Payer: Self-pay

## 2013-08-06 ENCOUNTER — Telehealth (HOSPITAL_COMMUNITY): Payer: Self-pay

## 2013-08-06 NOTE — Telephone Encounter (Signed)
08/06/13-- Several msgs .have been left for patient to follow up w/Dr. Kristin Bruins after surgery. Patient's mother has been contacted as well.  Patient has not returned any phone calls to office. Unable to reach patient to follow up with treatment. LRI

## 2013-09-25 IMAGING — CR DG CHEST 2V
2 series · 2 of 2 positions shown · non-contrast
Comparison: 07/24/2007

CLINICAL DATA: Chest pain and weakness.

CHEST - 2 VIEW

[w chest lat]
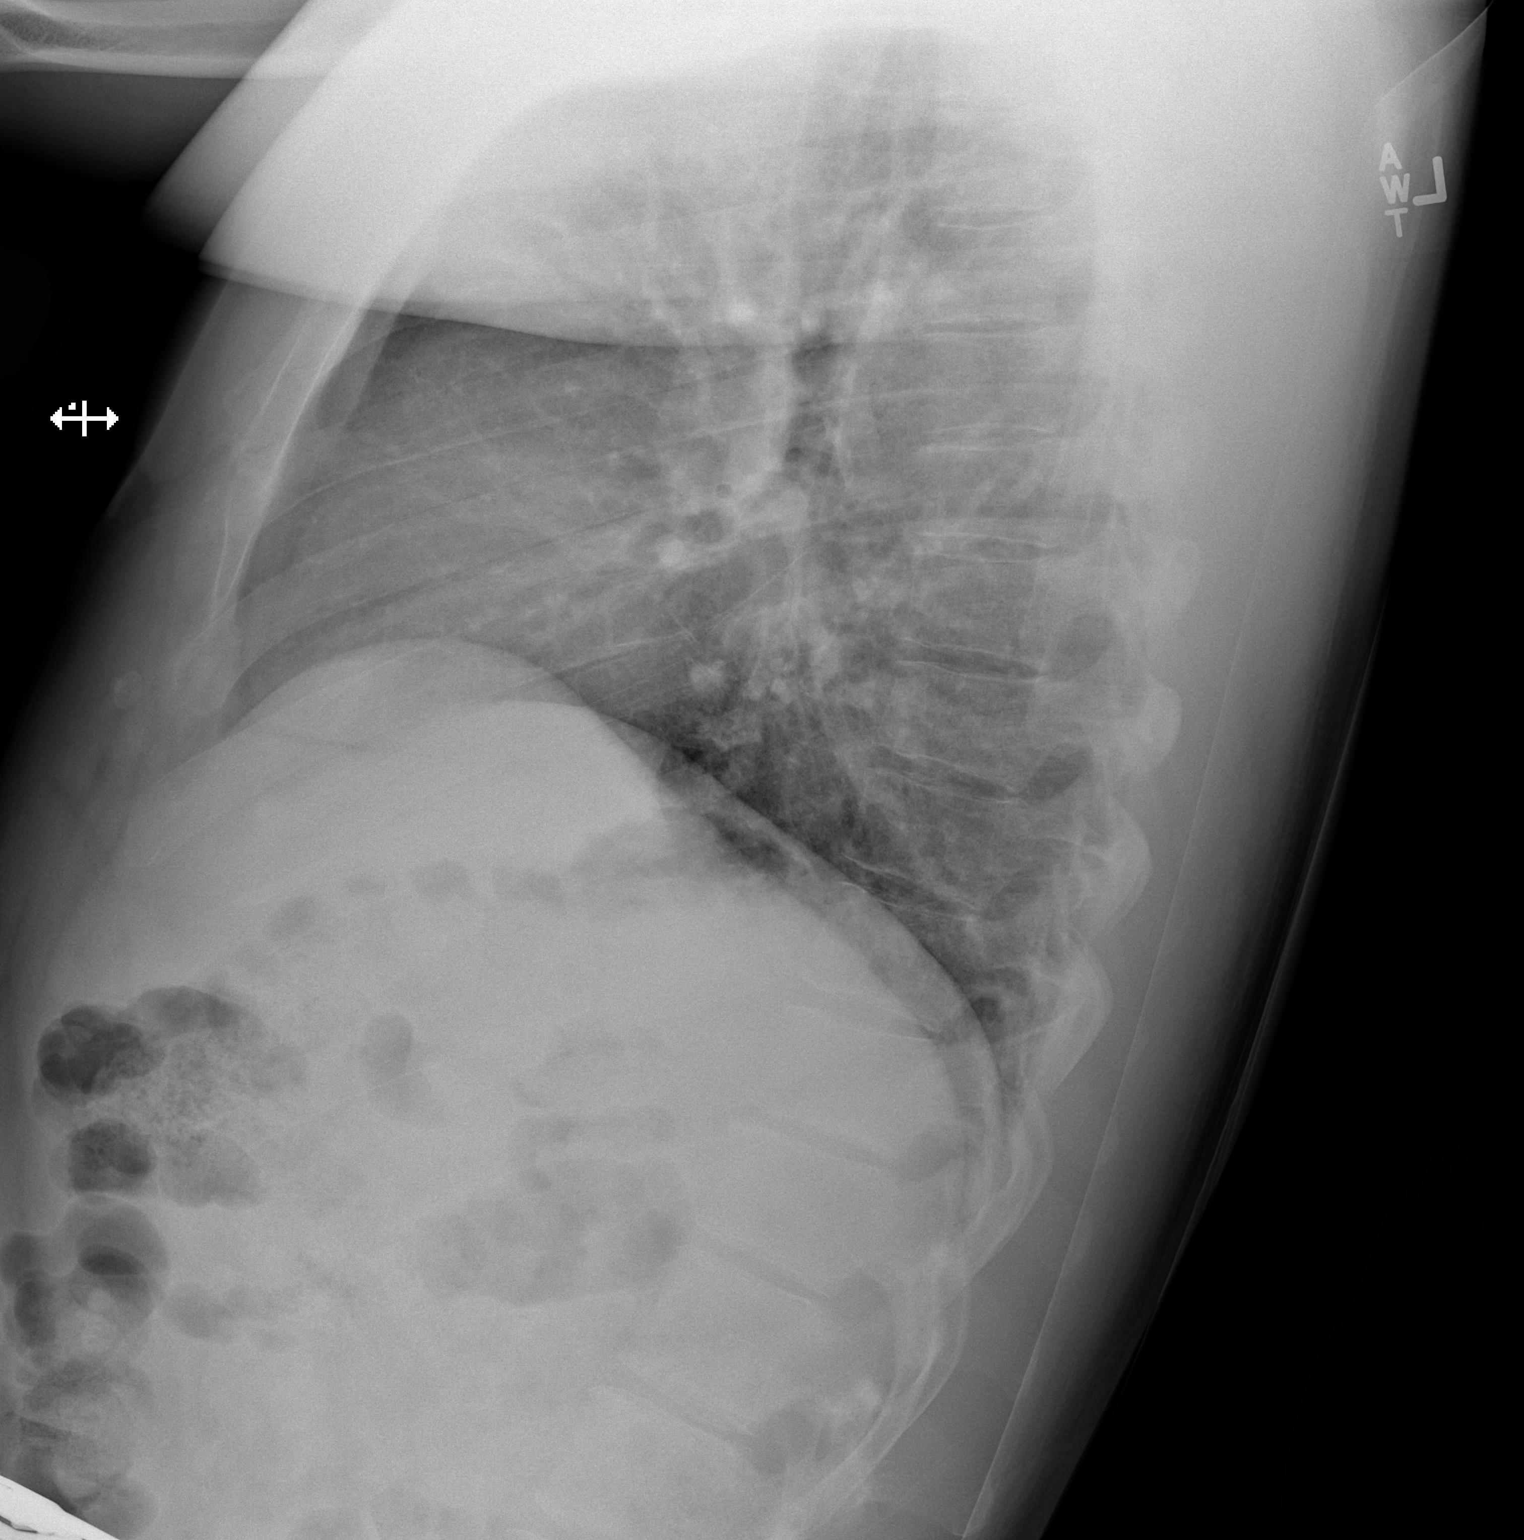

[x chest ap]
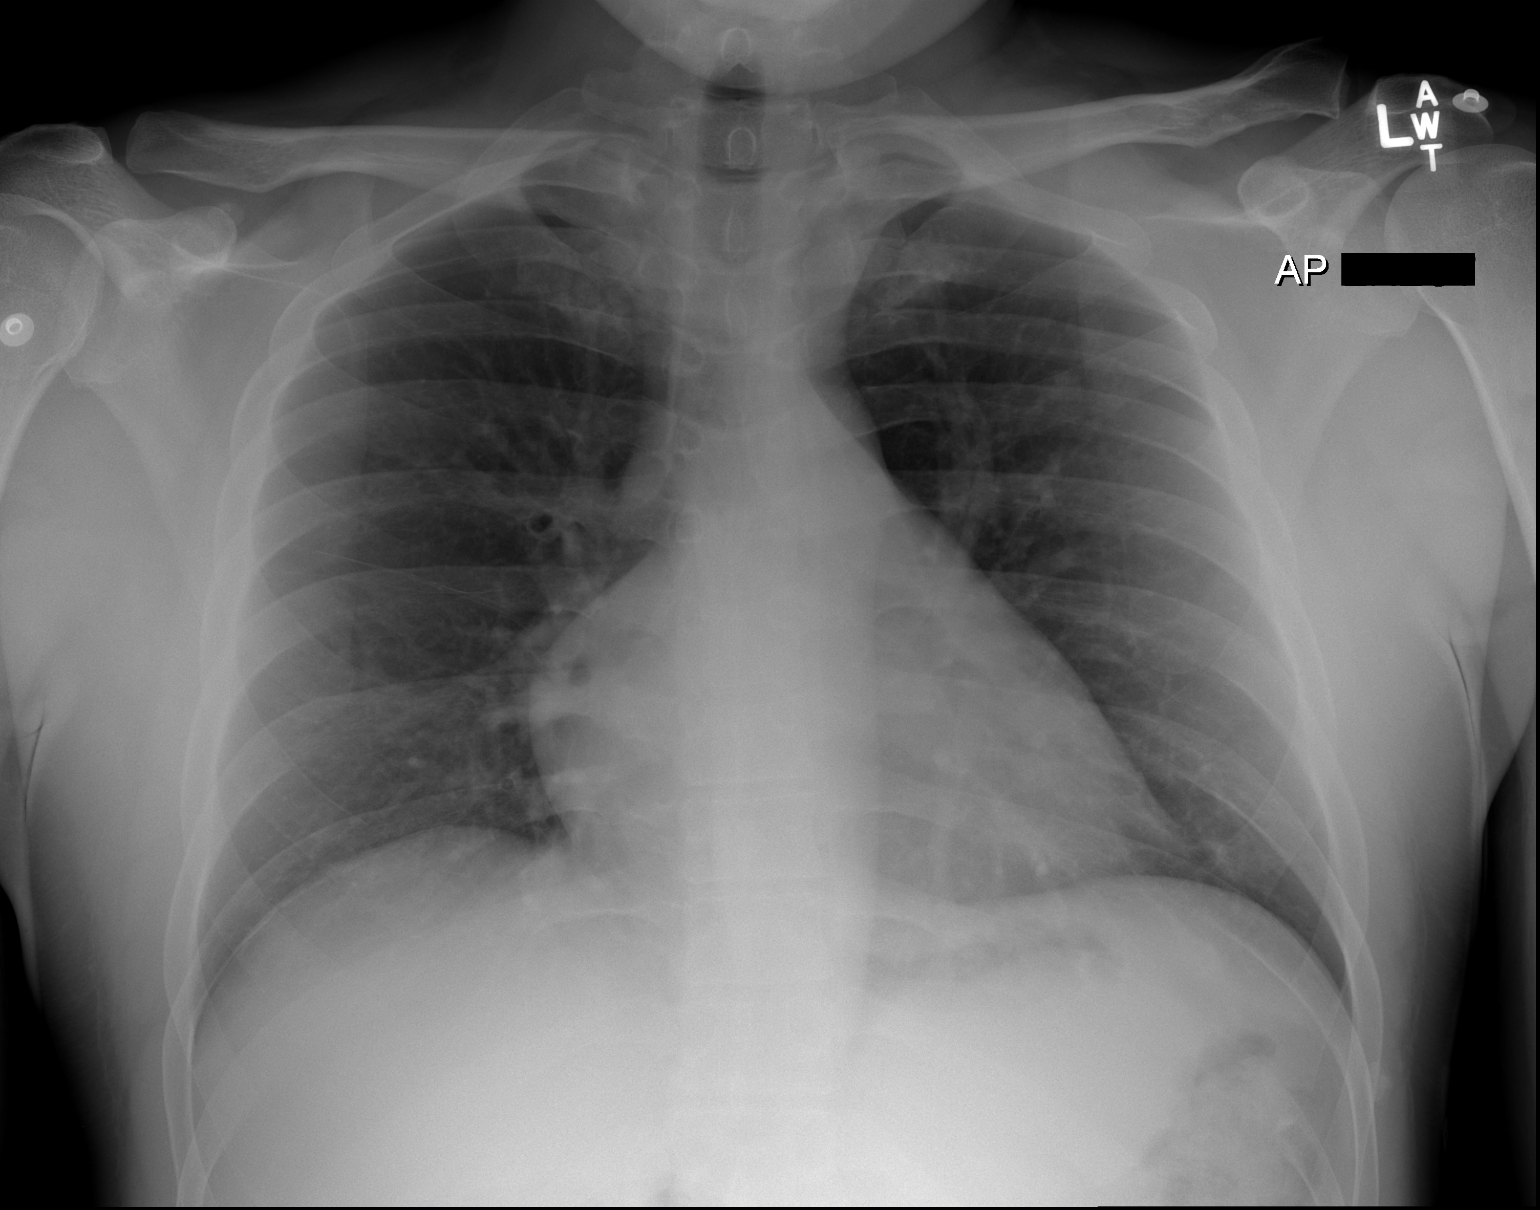

[2 of 2 positions shown; findings below may reference images not displayed]

FINDINGS: The lungs are clear without focal infiltrate, edema,
pneumothorax or pleural effusion. The cardiopericardial silhouette
is within normal limits for size. Imaged bony structures of the
thorax are intact.
IMPRESSION: Stable.  No acute findings.

## 2014-03-19 IMAGING — CR DG HAND COMPLETE 3+V*L*
3 series · 3 of 3 positions shown · non-contrast
Comparison: None.

CLINICAL DATA: Fall with pain.

LEFT HAND - COMPLETE 3+ VIEW

[x hand pa left]
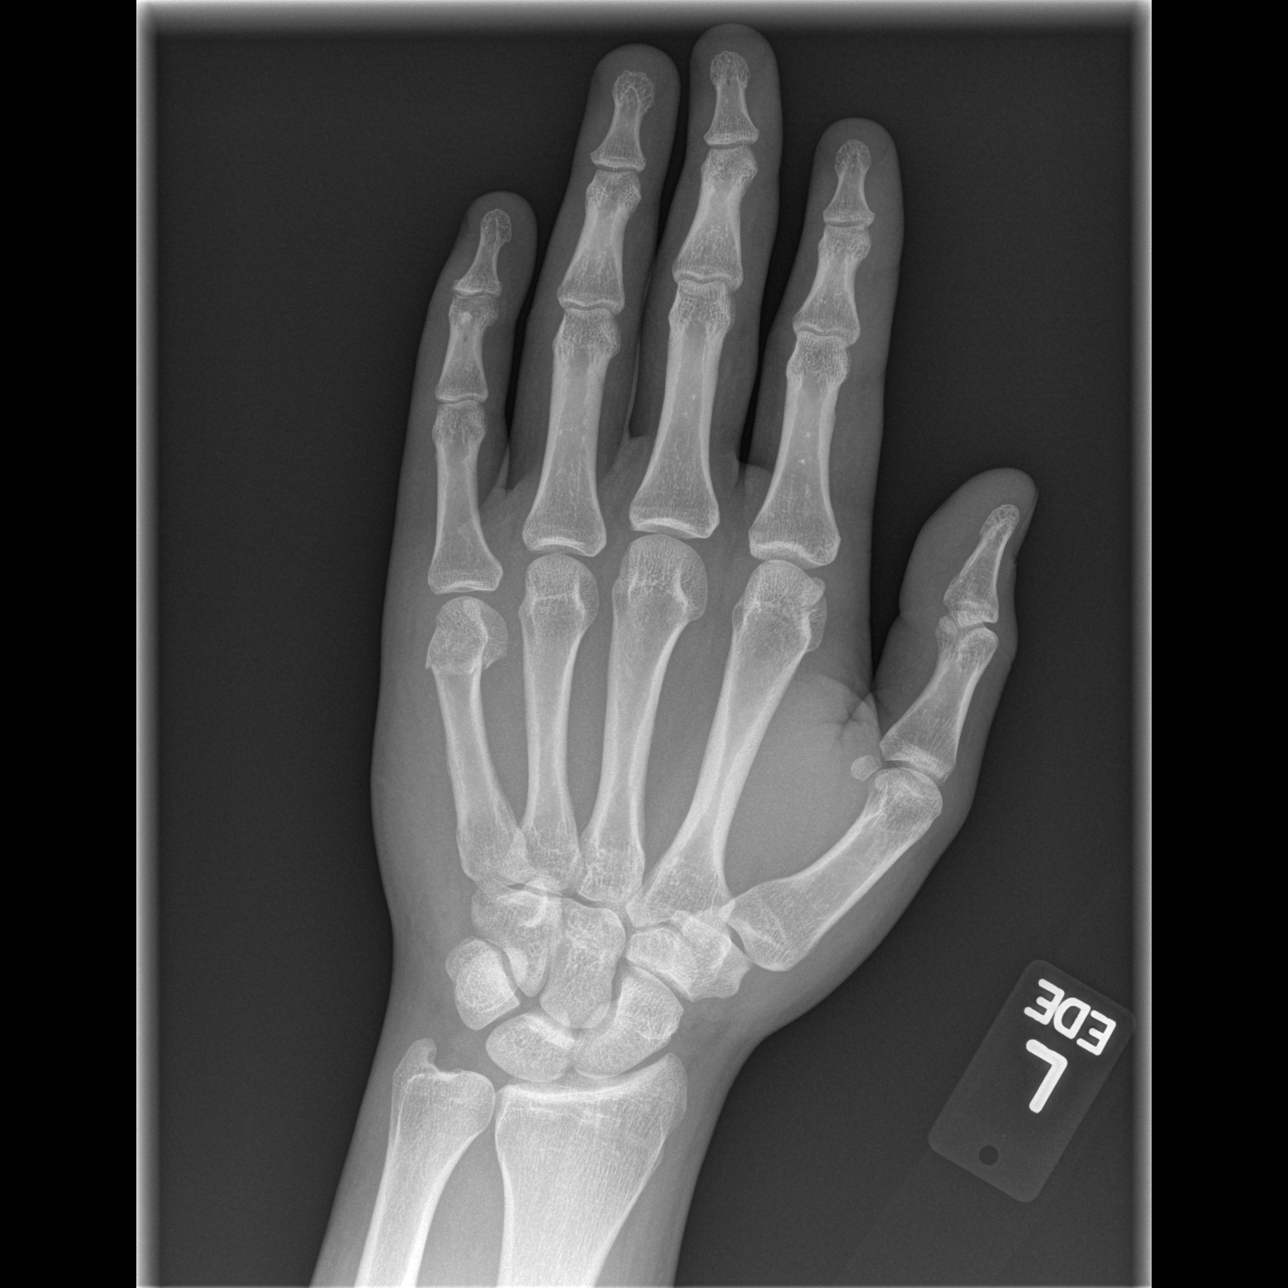

[x hand oblique left]
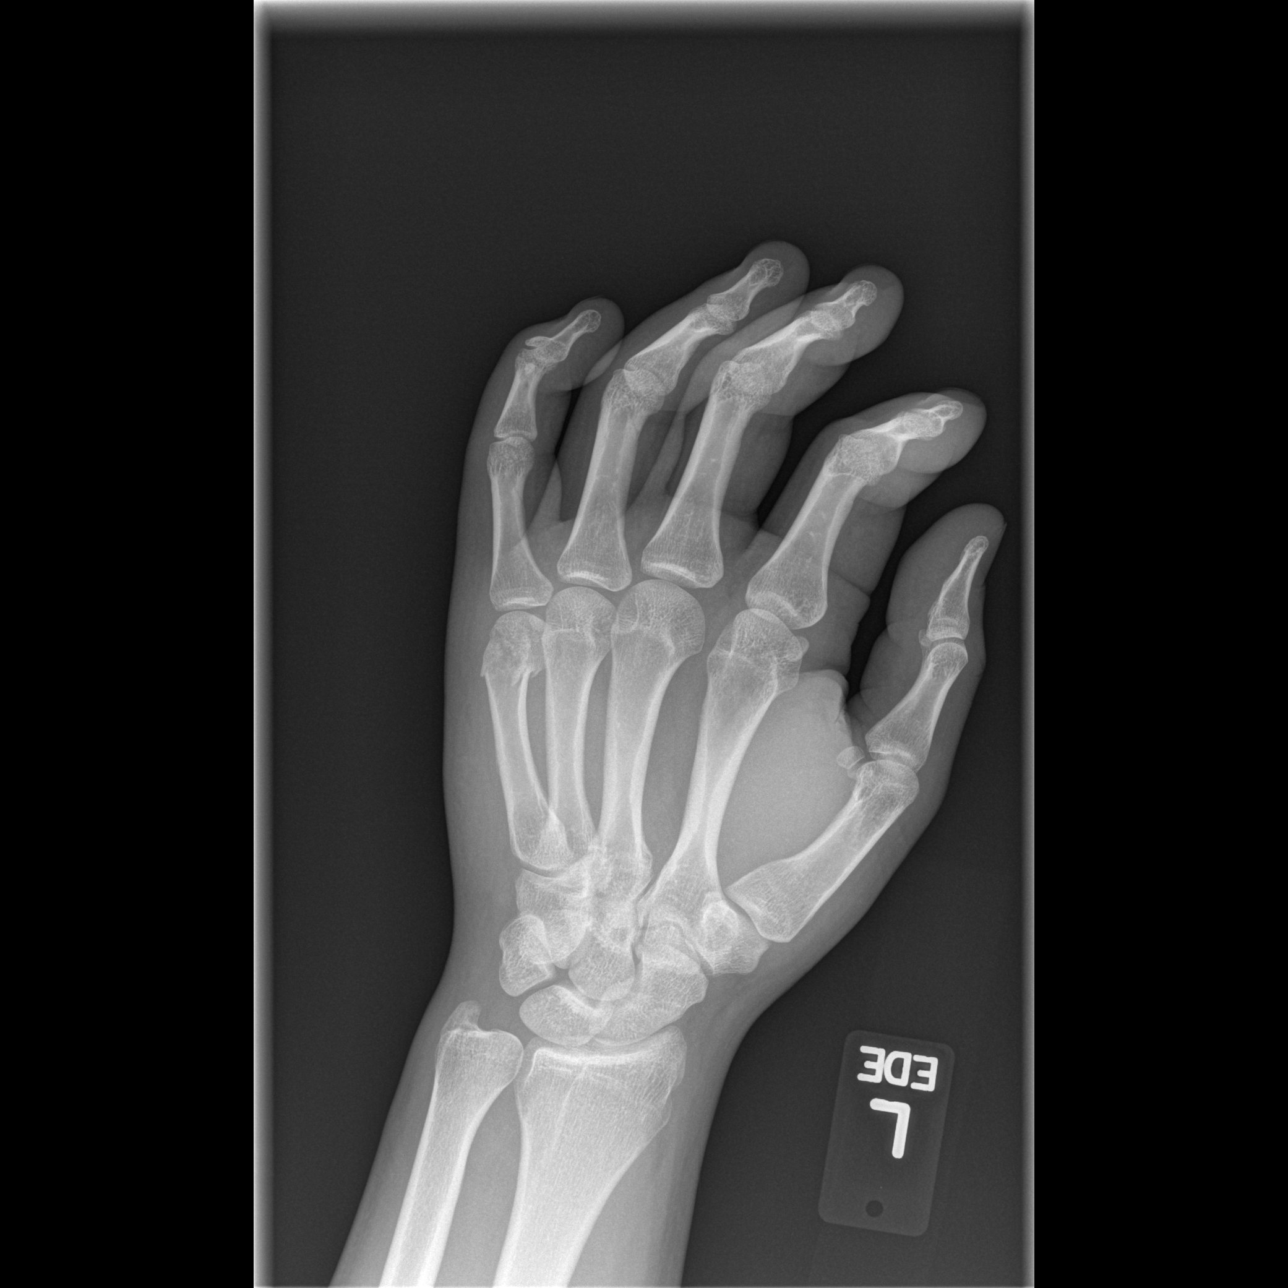

[x hand lat left]
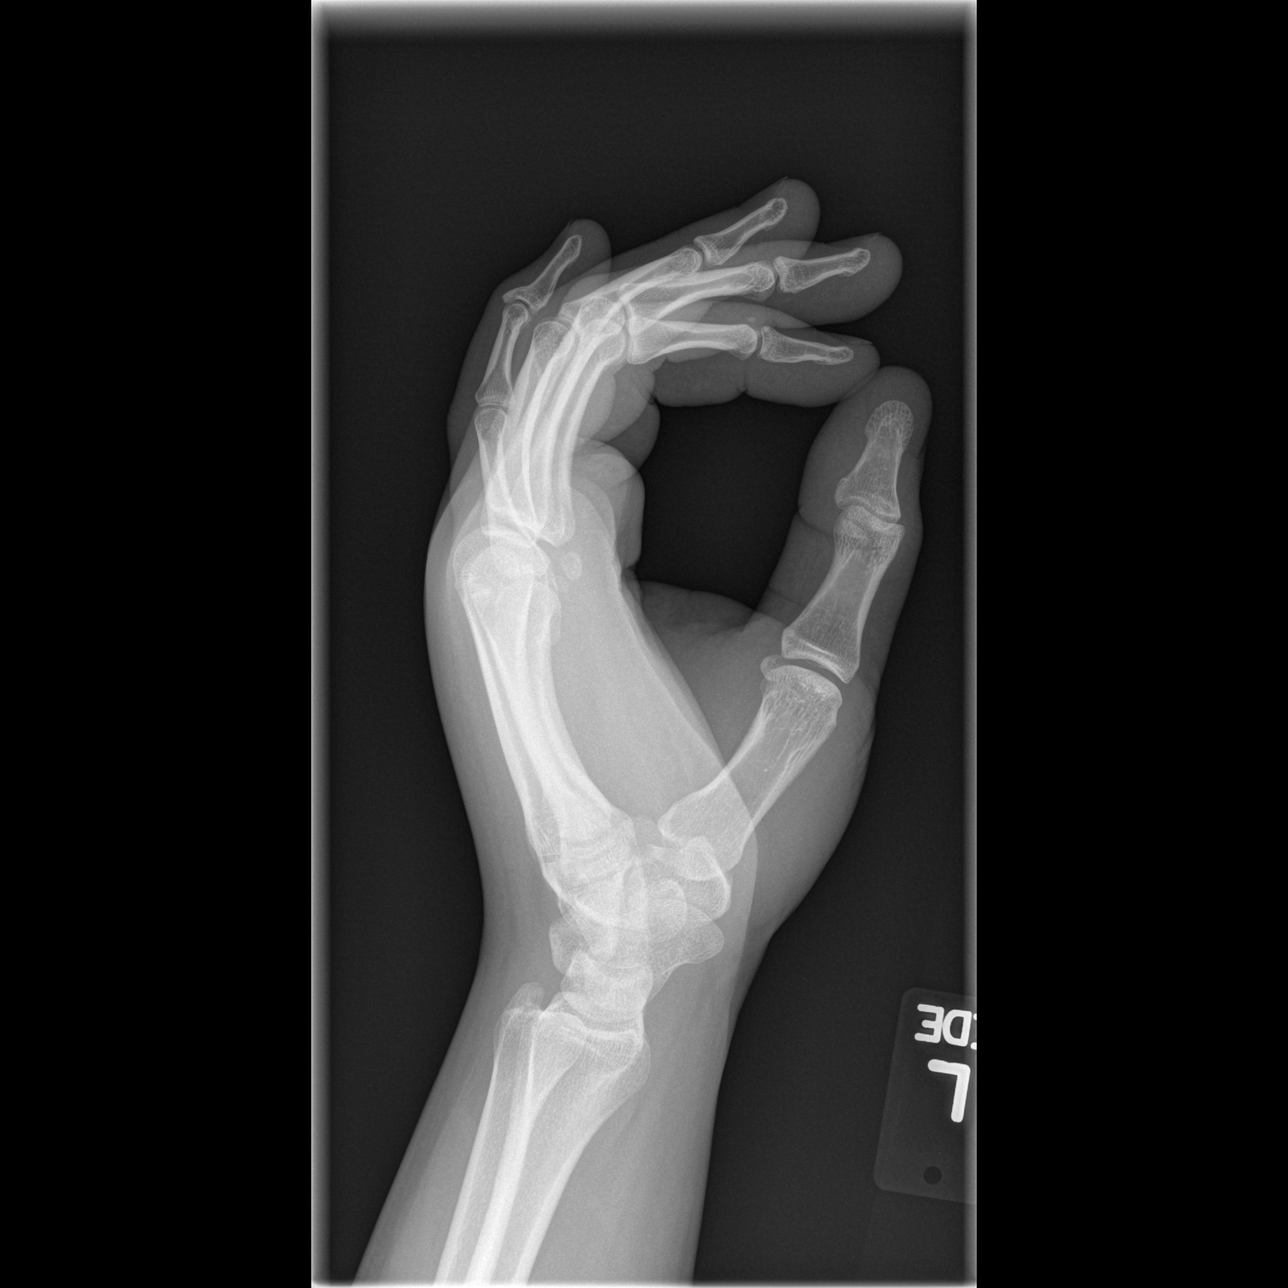

[3 of 3 positions shown; findings below may reference images not displayed]

FINDINGS: There is a mildly angulated fracture of the fifth
metacarpal neck.  Difficult to determine if the fracture line
extends into the metacarpal phalangeal joint.  Overlying soft
tissue swelling.  No additional evidence of fracture.
IMPRESSION: Fifth metacarpal neck fracture.

## 2015-04-01 ENCOUNTER — Emergency Department (HOSPITAL_COMMUNITY)
Admission: EM | Admit: 2015-04-01 | Discharge: 2015-04-01 | Disposition: A | Discharge status: Home / Self Care | Payer: Self-pay | Attending: Emergency Medicine | Admitting: Emergency Medicine

## 2015-04-01 ENCOUNTER — Encounter (HOSPITAL_COMMUNITY): Payer: Self-pay | Admitting: Physical Medicine and Rehabilitation

## 2015-04-01 DIAGNOSIS — L03113 Cellulitis of right upper limb: Secondary | ICD-10-CM | POA: Insufficient documentation

## 2015-04-01 DIAGNOSIS — S60561A Insect bite (nonvenomous) of right hand, initial encounter: Secondary | ICD-10-CM | POA: Insufficient documentation

## 2015-04-01 DIAGNOSIS — Z79899 Other long term (current) drug therapy: Secondary | ICD-10-CM | POA: Insufficient documentation

## 2015-04-01 DIAGNOSIS — Z720 Tobacco use: Secondary | ICD-10-CM | POA: Insufficient documentation

## 2015-04-01 DIAGNOSIS — Y9289 Other specified places as the place of occurrence of the external cause: Secondary | ICD-10-CM | POA: Insufficient documentation

## 2015-04-01 DIAGNOSIS — Y998 Other external cause status: Secondary | ICD-10-CM | POA: Insufficient documentation

## 2015-04-01 DIAGNOSIS — Z792 Long term (current) use of antibiotics: Secondary | ICD-10-CM | POA: Insufficient documentation

## 2015-04-01 DIAGNOSIS — Z880 Allergy status to penicillin: Secondary | ICD-10-CM | POA: Insufficient documentation

## 2015-04-01 DIAGNOSIS — Y9389 Activity, other specified: Secondary | ICD-10-CM | POA: Insufficient documentation

## 2015-04-01 DIAGNOSIS — W57XXXA Bitten or stung by nonvenomous insect and other nonvenomous arthropods, initial encounter: Secondary | ICD-10-CM | POA: Insufficient documentation

## 2015-04-01 MED ORDER — IBUPROFEN 400 MG PO TABS
800.0000 mg | ORAL_TABLET | Freq: Once | ORAL | Status: AC
  Administered 2015-04-01: 800 mg via ORAL
  Filled 2015-04-01: qty 2

## 2015-04-01 MED ORDER — NAPROXEN 500 MG PO TABS: 500.0000 mg | ORAL_TABLET | Freq: Two times a day (BID) | ORAL | Status: DC

## 2015-04-01 MED ORDER — DIPHENHYDRAMINE HCL 25 MG PO CAPS
25.0000 mg | ORAL_CAPSULE | Freq: Once | ORAL | Status: AC
  Administered 2015-04-01: 25 mg via ORAL
  Filled 2015-04-01: qty 1

## 2015-04-01 MED ORDER — CEPHALEXIN 500 MG PO CAPS: 500.0000 mg | ORAL_CAPSULE | Freq: Four times a day (QID) | ORAL | Status: DC

## 2015-04-01 NOTE — ED Notes (Signed)
Pt presents to department for evaluation of possible insect bite to R arm. Reports swelling and pain to R hand/arm this morning. Pt states "I think this could be a spider bite."

## 2015-04-01 NOTE — ED Provider Notes (Signed)
CSN: [id number]     Arrival date & time 04/01/15  0819 History  This chart was scribed for non-physician practitioner, Santiago GladHeather Teah Votaw, PA-C working with Geoffery Lyonsouglas Delo, MD by Placido SouLogan Joldersma, ED scribe. This patient was seen in room TR08C/TR08C and the patient's care was started at 9:26 AM.    Chief Complaint  Patient presents with  . Insect Bite  . Arm Pain  . Hand Pain   The history is provided by the patient. No language interpreter was used.    HPI Comments: Andrew Keller is a 29 y.o. male who presents to the Emergency Department complaining of constant, moderate, pain and swelling to the right hand with onset yesterday. Pt notes that he was in the woods with multiple spiders present and is concerned he was bitten by a spider. He notes feeling the swelling almost immediately with worsening symptoms spreading from the location of the bite across his hand. Pt denies any history of insect bites or DM. He notes an allergy to penicillin but denies any other known drug allergies. He denies SOB, nausea, vomiting, swelling of the tongue or throat, fever, or chills.   History reviewed. No pertinent past medical history. Past Surgical History  Procedure Laterality Date  . Multiple extractions with alveoloplasty N/A 07/05/2013    Procedure: MULTIPLE EXTRACTION OF TOOTH #'S [phone],5,7,12,18,31 WITH ALVEOLOPLASTY A ND INCISION AND DRAINAGE OF THE MAXILLARY RIGHT BUCCAL VESTIBULE AREA 4-5.;  Surgeon: Charlynne Panderonald F Kulinski, DDS;  Location: [facility] OR;  Service: Oral Surgery;  Laterality: N/A;   History reviewed. No pertinent family history. History  Substance Use Topics  . Smoking status: Current Every Day Smoker -- 1.00 packs/day for 10 years    Types: Cigarettes  . Smokeless tobacco: Not on file  . Alcohol Use: 2.0 - 3.0 oz/week    4-6 drink(s) per week    Review of Systems  Constitutional: Negative for fever and chills.  HENT: Negative for trouble swallowing.   Respiratory: Negative for shortness of  breath.   Gastrointestinal: Negative for nausea and vomiting.  Musculoskeletal: Positive for myalgias, joint swelling and arthralgias.  Skin: Negative for rash.      Allergies  Penicillins  Home Medications   Prior to Admission medications   Medication Sig Start Date End Date Taking? Authorizing Provider  clindamycin (CLEOCIN) 150 MG capsule Take 3 capsules (450 mg total) by mouth 4 (four) times daily. 07/06/13   Leona SingletonMaria T Thekkekandam, MD  oxyCODONE-acetaminophen (PERCOCET/ROXICET) 5-325 MG per tablet Take 1-2 tablets by mouth every 4 (four) hours as needed. 07/06/13   Leona SingletonMaria T Thekkekandam, MD  saccharomyces boulardii (FLORASTOR) 250 MG capsule Take 1 capsule (250 mg total) by mouth 2 (two) times daily. 07/06/13   Leona SingletonMaria T Thekkekandam, MD   BP 134/77 mmHg  Pulse 78  Temp(Src) 97.3 F (36.3 C) (Oral)  Resp 20  SpO2 100% Physical Exam  Constitutional: He is oriented to person, place, and time. He appears well-developed and well-nourished. No distress.  HENT:  Head: Normocephalic and atraumatic.  Mouth/Throat: Oropharynx is clear and moist.  No swelling of the lips, tongue, or throat.  Eyes: Conjunctivae and EOM are normal. Pupils are equal, round, and reactive to light.  Neck: Normal range of motion. Neck supple. No tracheal deviation present.  Cardiovascular: Normal rate, regular rhythm and intact distal pulses.   2+ radial pulse of right hand  Pulmonary/Chest: Effort normal and breath sounds normal. No respiratory distress. He has no wheezes.  Abdominal: Soft.  Musculoskeletal: Normal range  of motion. He exhibits edema.  Diffuse swelling over dorsal aspect of right hand and thumb extending into right forearm; no erythematous streaking; full ROM of right wrist; warmth present  Neurological: He is alert and oriented to person, place, and time.  Skin: Skin is warm and dry.  No puncture wounds or abscess of the right arm visualized.  Psychiatric: He has a normal mood and affect.  His behavior is normal.  Nursing note and vitals reviewed.   ED Course  Procedures  DIAGNOSTIC STUDIES: Oxygen Saturation is 100% on RA, normal by my interpretation.    COORDINATION OF CARE: 9:32 AM Discussed treatment plan with pt at bedside and pt agreed to plan.  Labs Review Labs Reviewed - No data to display  Imaging Review No results found.   EKG Interpretation None      MDM   Final diagnoses:  None   Patient presents today with possible spider bite of the right hand yesterday.  Patient with warmth and edema of the hand.  Neurovascularly intact.  Patient with full ROM of all fingers and right wrist.  Patient afebrile.  Will treat for possible Cellulitis.  Patient given Rx for keflex.  Patient instructed to follow up in 2 days for recheck or sooner if worsening symptoms.  Return precautions given.    Santiago Glad, PA-C 04/01/15 1617  Geoffery Lyons, MD 04/02/15 [phone]

## 2015-04-18 ENCOUNTER — Emergency Department (HOSPITAL_COMMUNITY): Payer: Self-pay

## 2015-04-18 ENCOUNTER — Encounter (HOSPITAL_COMMUNITY): Payer: Self-pay | Admitting: Nurse Practitioner

## 2015-04-18 ENCOUNTER — Emergency Department (HOSPITAL_COMMUNITY)
Admission: EM | Admit: 2015-04-18 | Discharge: 2015-04-18 | Disposition: A | Discharge status: Home / Self Care | Payer: Self-pay | Attending: Emergency Medicine | Admitting: Emergency Medicine

## 2015-04-18 DIAGNOSIS — Z720 Tobacco use: Secondary | ICD-10-CM | POA: Insufficient documentation

## 2015-04-18 DIAGNOSIS — J020 Streptococcal pharyngitis: Secondary | ICD-10-CM | POA: Insufficient documentation

## 2015-04-18 DIAGNOSIS — Z880 Allergy status to penicillin: Secondary | ICD-10-CM | POA: Insufficient documentation

## 2015-04-18 DIAGNOSIS — R1084 Generalized abdominal pain: Secondary | ICD-10-CM | POA: Insufficient documentation

## 2015-04-18 DIAGNOSIS — Z79899 Other long term (current) drug therapy: Secondary | ICD-10-CM | POA: Insufficient documentation

## 2015-04-18 DIAGNOSIS — R590 Localized enlarged lymph nodes: Secondary | ICD-10-CM | POA: Insufficient documentation

## 2015-04-18 DIAGNOSIS — R000 Tachycardia, unspecified: Secondary | ICD-10-CM | POA: Insufficient documentation

## 2015-04-18 DIAGNOSIS — Z791 Long term (current) use of non-steroidal anti-inflammatories (NSAID): Secondary | ICD-10-CM | POA: Insufficient documentation

## 2015-04-18 LAB — CBC
HEMATOCRIT: 47.5 % (ref 39.0–52.0)
HEMOGLOBIN: 17.1 g/dL — AB (ref 13.0–17.0)
MCH: 32.1 pg (ref 26.0–34.0)
MCHC: 36 g/dL (ref 30.0–36.0)
MCV: 89.1 fL (ref 78.0–100.0)
Platelets: 227 10*3/uL (ref 150–400)
RBC: 5.33 MIL/uL (ref 4.22–5.81)
RDW: 12.9 % (ref 11.5–15.5)
WBC: 12 10*3/uL — ABNORMAL HIGH (ref 4.0–10.5)

## 2015-04-18 LAB — BASIC METABOLIC PANEL
Anion gap: 12 (ref 5–15)
BUN: 8 mg/dL (ref 6–20)
CO2: 24 mmol/L (ref 22–32)
Calcium: 9.5 mg/dL (ref 8.9–10.3)
Chloride: 98 mmol/L — ABNORMAL LOW (ref 101–111)
Creatinine, Ser: 1.23 mg/dL (ref 0.61–1.24)
GFR calc Af Amer: >60 mL/min (ref >60)
GFR calc non Af Amer: >60 mL/min (ref >60)
Glucose, Bld: 114 mg/dL — ABNORMAL HIGH (ref 65–99)
Potassium: 4 mmol/L (ref 3.5–5.1)
SODIUM: 134 mmol/L — AB (ref 135–145)

## 2015-04-18 LAB — RAPID STREP SCREEN (MED CTR MEBANE ONLY): Streptococcus, Group A Screen (Direct): POSITIVE — AB

## 2015-04-18 MED ORDER — SODIUM CHLORIDE 0.9 % IV BOLUS (SEPSIS)
1000.0000 mL | Freq: Once | INTRAVENOUS | Status: AC
  Administered 2015-04-18: 1000 mL via INTRAVENOUS

## 2015-04-18 MED ORDER — NAPROXEN 500 MG PO TABS: 500.0000 mg | ORAL_TABLET | Freq: Two times a day (BID) | ORAL | Status: DC

## 2015-04-18 MED ORDER — KETOROLAC TROMETHAMINE 30 MG/ML IJ SOLN
30.0000 mg | Freq: Once | INTRAMUSCULAR | Status: AC
  Administered 2015-04-18: 30 mg via INTRAVENOUS
  Filled 2015-04-18: qty 1

## 2015-04-18 MED ORDER — AZITHROMYCIN 250 MG PO TABS
500.0000 mg | ORAL_TABLET | Freq: Once | ORAL | Status: AC
  Administered 2015-04-18: 500 mg via ORAL
  Filled 2015-04-18: qty 2

## 2015-04-18 MED ORDER — AZITHROMYCIN 250 MG PO TABS: 250.0000 mg | ORAL_TABLET | Freq: Every day | ORAL | Status: DC

## 2015-04-18 MED ORDER — HYDROCODONE-ACETAMINOPHEN 5-325 MG PO TABS: 1.0000 | ORAL_TABLET | ORAL | Status: DC | PRN

## 2015-04-18 MED ORDER — DEXAMETHASONE SODIUM PHOSPHATE 10 MG/ML IJ SOLN
10.0000 mg | Freq: Once | INTRAMUSCULAR | Status: AC
  Administered 2015-04-18: 10 mg via INTRAVENOUS
  Filled 2015-04-18: qty 1

## 2015-04-18 MED ORDER — CYCLOBENZAPRINE HCL 10 MG PO TABS: 10.0000 mg | ORAL_TABLET | Freq: Two times a day (BID) | ORAL | Status: DC | PRN

## 2015-04-18 NOTE — ED Notes (Signed)
Patient brought back and hooked up. PA at bedside.

## 2015-04-18 NOTE — Discharge Instructions (Signed)
Please read and follow all provided instructions.  Your diagnoses today include:  1. Acute streptococcal pharyngitis     Tests performed today include:  Strep test: was POSITIVE for strep throat  Vital signs. See below for your results today.   Medications prescribed:   Azithromycin - antibiotic for respiratory infection  You have been prescribed an antibiotic medicine: take the entire course of medicine even if you are feeling better. Stopping early can cause the antibiotic not to work.   Naproxen - anti-inflammatory pain medication  Do not exceed  naproxen every 12 hours, take with food  You have been prescribed an anti-inflammatory medication or NSAID. Take with food. Take smallest effective dose for the shortest duration needed for your pain. Stop taking if you experience stomach pain or vomiting.   Take any medications prescribed only as directed.   Home care instructions:  Please read the educational materials provided and follow any instructions contained in this packet.  Follow-up instructions: Please follow-up with your primary care provider as needed for further evaluation of your symptoms.  Return instructions:   Please return to the Emergency Department if you experience worsening symptoms.   Return if you have worsening problems swallowing, your neck becomes swollen, you cannot swallow your saliva or your voice becomes muffled.   Return with high persistent fever, persistent vomiting, or if you have trouble breathing.   Please return if you have any other emergent concerns.  Additional Information:  Your vital signs today were: BP 102/60 mmHg   Pulse 74   Temp(Src) 99.3 F (37.4 C) (Oral)   Resp 17   Ht  (1.803 m)   Wt 203 lb 8 oz (92.307 kg)   BMI 28.40 kg/m2   SpO2 98% If your blood pressure (BP) was elevated above 135/85 this visit, please have this repeated by your doctor within one month. --------------

## 2015-04-18 NOTE — ED Provider Notes (Signed)
CSN: [id number]     Arrival date & time 04/18/15  1117 History   First MD Initiated Contact with Patient 04/18/15 1147     Chief Complaint  Patient presents with  . Generalized Body Aches     (Consider location/radiation/quality/duration/timing/severity/associated sxs/prior Treatment) HPI Comments: Patient with no significant past medical history presents with complaint of generalized body aches, sore throat, fatigue, chills over the past 3 days. No documented fever. He has not had cough. Patient vomited once yesterday. No diarrhea. No urinary symptoms or rash. No recent tick bites. No known sick contacts. Swallowing solids and liquids make the pain in his throat worse. No treatments prior to arrival. Onset of symptoms acute. Course is constant. Nothing makes symptoms better.  The history is provided by the patient and medical records.    History reviewed. No pertinent past medical history. Past Surgical History  Procedure Laterality Date  . Multiple extractions with alveoloplasty N/A 07/05/2013    Procedure: MULTIPLE EXTRACTION OF TOOTH #'S [phone] WITH ALVEOLOPLASTY A ND INCISION AND DRAINAGE OF THE MAXILLARY RIGHT BUCCAL VESTIBULE AREA 4-5.;  Surgeon: Charlynne Pander, DDS;  Location: [facility] OR;  Service: Oral Surgery;  Laterality: N/A;   History reviewed. No pertinent family history. History  Substance Use Topics  . Smoking status: Current Every Day Smoker -- 1.00 packs/day for 10 years    Types: Cigarettes  . Smokeless tobacco: Not on file  . Alcohol Use: 2.0 - 3.0 oz/week    4-6 drink(s) per week    Review of Systems  Constitutional: Positive for chills and fatigue. Negative for fever.  HENT: Positive for sore throat. Negative for congestion, ear pain, rhinorrhea and sinus pressure.   Eyes: Negative for redness.  Respiratory: Negative for cough and wheezing.   Cardiovascular: Negative for leg swelling.  Gastrointestinal: Positive for nausea, vomiting and abdominal pain  (generalized). Negative for diarrhea.  Genitourinary: Negative for dysuria.  Musculoskeletal: Positive for myalgias. Negative for neck pain and neck stiffness.  Skin: Negative for rash.  Neurological: Negative for headaches.  Hematological: Negative for adenopathy.      Allergies  Penicillins  Home Medications   Prior to Admission medications   Medication Sig Start Date End Date Taking? Authorizing Provider  clindamycin (CLEOCIN) 150 MG capsule Take 3 capsules (450 mg total) by mouth 4 (four) times daily. 07/06/13   Leona Singleton, MD  naproxen (NAPROSYN) 500 MG tablet Take 1 tablet (500 mg total) by mouth 2 (two) times daily. 04/01/15   Santiago Glad, PA-C  oxyCODONE-acetaminophen (PERCOCET/ROXICET) 5-325 MG per tablet Take 1-2 tablets by mouth every 4 (four) hours as needed. 07/06/13   Leona Singleton, MD  saccharomyces boulardii (FLORASTOR) 250 MG capsule Take 1 capsule (250 mg total) by mouth 2 (two) times daily. 07/06/13   Leona Singleton, MD   BP 122/74 mmHg  Pulse 118  Temp(Src) 99.3 F (37.4 C) (Oral)  Resp 17  Ht 5\' 11"  (1.803 m)  Wt 203 lb 8 oz (92.307 kg)  BMI 28.40 kg/m2  SpO2 94% Physical Exam  Constitutional: He appears well-developed and well-nourished.  HENT:  Head: Normocephalic and atraumatic.  Right Ear: Tympanic membrane, external ear and ear canal normal.  Left Ear: Tympanic membrane, external ear and ear canal normal.  Nose: Nose normal. No mucosal edema or rhinorrhea.  Mouth/Throat: Uvula is midline and mucous membranes are normal. Mucous membranes are not dry. No trismus in the jaw. No uvula swelling. Posterior oropharyngeal edema and posterior oropharyngeal erythema present.  No oropharyngeal exudate or tonsillar abscesses.  Eyes: Conjunctivae are normal. Right eye exhibits no discharge. Left eye exhibits no discharge.  Neck: Normal range of motion. Neck supple.  No meningismus exhibited.   Cardiovascular: Regular rhythm and normal  heart sounds.  Tachycardia present.   Pulmonary/Chest: Effort normal and breath sounds normal. No respiratory distress. He has no wheezes. He has no rales.  Abdominal: Soft. He exhibits no distension. There is tenderness (mild, generalized). There is no rebound and no guarding.  Musculoskeletal: He exhibits no edema.  Lymphadenopathy:    He has cervical adenopathy.  Neurological: He is alert.  Skin: Skin is warm and dry.  Psychiatric: He has a normal mood and affect.  Nursing note and vitals reviewed.   ED Course  Procedures (including critical care time) Labs Review Labs Reviewed  RAPID STREP SCREEN (NOT AT [facility]) - Abnormal; Notable for the following:    Streptococcus, Group A Screen (Direct) POSITIVE (*)    All other components within normal limits  CBC - Abnormal; Notable for the following:    WBC 12.0 (*)    Hemoglobin 17.1 (*)    All other components within normal limits  BASIC METABOLIC PANEL - Abnormal; Notable for the following:    Sodium 134 (*)    Chloride 98 (*)    Glucose, Bld 114 (*)    All other components within normal limits    Imaging Review Dg Chest 2 View  04/18/2015   CLINICAL DATA:  Weakness and shortness of breath for 3 days  EXAM: CHEST - 2 VIEW  COMPARISON:  09/08/2011  FINDINGS: Cardiac shadow is stable. The lungs are well aerated bilaterally. No focal infiltrate or sizable effusion is seen. No acute bony abnormality is noted.  IMPRESSION: No active disease.   Electronically Signed   By: Alcide Clever M.D.   On: 04/18/2015 13:24     ED ECG REPORT   Date: 04/18/2015  Rate: 118  Rhythm: sinus tachycardia  QRS Axis: normal  Intervals: normal  ST/T Wave abnormalities: normal  Conduction Disutrbances:none  Narrative Interpretation:   Old EKG Reviewed: changes noted, new tachycardia  I have personally reviewed the EKG tracing and agree with the computerized printout as noted.   12:28 PM Patient seen and examined. Work-up initiated. Medications  ordered.   Vital signs reviewed and are as follows: BP 122/74 mmHg  Pulse 118  Temp(Src) 99.3 F (37.4 C) (Oral)  Resp 17  Ht 5\' 11"  (1.803 m)  Wt 203 lb 8 oz (92.307 kg)  BMI 28.40 kg/m2  SpO2 94%  2:20 PM Patient updated on results. Azithromycin given. Anticipate d/c to home when fluids completed.   BP 102/60 mmHg  Pulse 74  Temp(Src) 99.3 F (37.4 C) (Oral)  Resp 17  Ht 5\' 11"  (1.803 m)  Wt 203 lb 8 oz (92.307 kg)  BMI 28.40 kg/m2  SpO2 98%  2:32 PM Will d/c to home. Patient counseled on supportive care and s/s to return including worsening symptoms, persistent fever, persistent vomiting, or if they have any other concerns. Urged to see PCP if symptoms persist for more than 3 days. Patient verbalizes understanding and agrees with plan.    MDM   Final diagnoses:  Acute streptococcal pharyngitis   Patient with exam consistent with streptococcal pharyngitis. Patient treated with fluids and anti-inflammatories. Will d/c to home with rest and symptomatic care. He is handing secretions and I do not suspect epiglottitis. Full ROM neck and do not suspect retropharyngeal abscess  or deep space infection in neck. No evidence of PTA on exam.   Renne Crigler, PA-C 04/18/15 1434  Alvira Monday, MD 04/18/15 2201

## 2015-04-18 NOTE — ED Notes (Signed)
He c/o body aches, fatigue, sore thraot, chills x 3 days. He has felt some SOB , denies cough. He tried cough drops with no relief

## 2015-07-21 IMAGING — CT CT MAXILLOFACIAL W/ CM
3 series · 16 of 47 positions shown, 19 images · IV contrast (omnipaque)
Comparison: None.

CLINICAL DATA: Abscess tooth. Right-sided facial pain.

EXAM:
CT MAXILLOFACIAL WITH CONTRAST
TECHNIQUE: Multidetector CT imaging of the maxillofacial structures was
performed with intravenous contrast. Multiplanar CT image
reconstructions were also generated. A small metallic BB was placed
on the right temple in order to reliably differentiate right from
left.
CONTRAST:  80mL OMNIPAQUE IOHEXOL 300 MG/ML  SOLN

[Series 2: facial bones · axial · 0.43mm/px · z∈[+34,+192]mm · 10 of 93 slices shown, 13 images]
[im 7/93  brain]
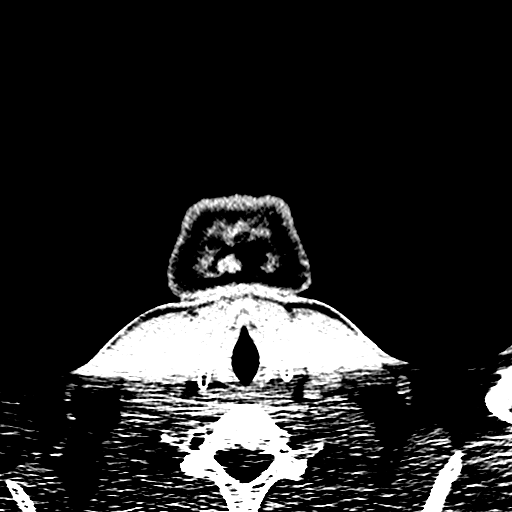
[im 7/93  bone]
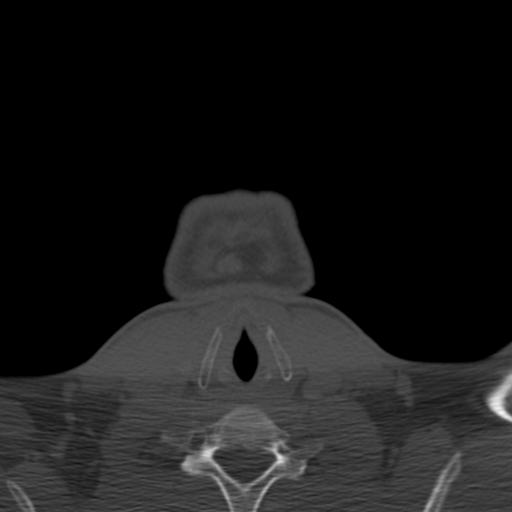
[im 16/93  bone]
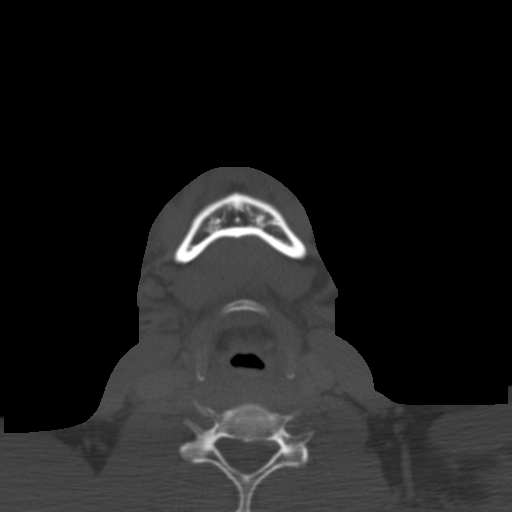
[im 26/93  bone]
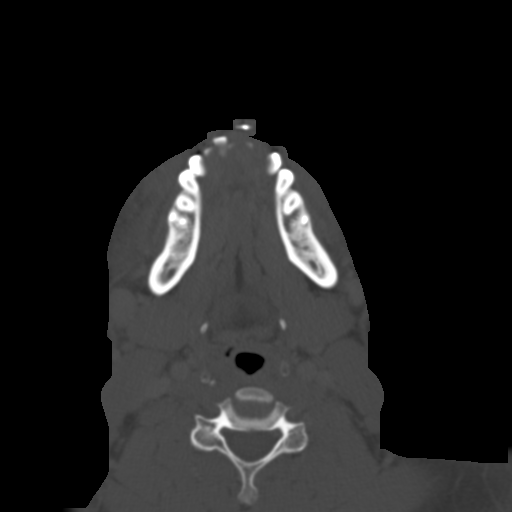
[im 32/93  bone]
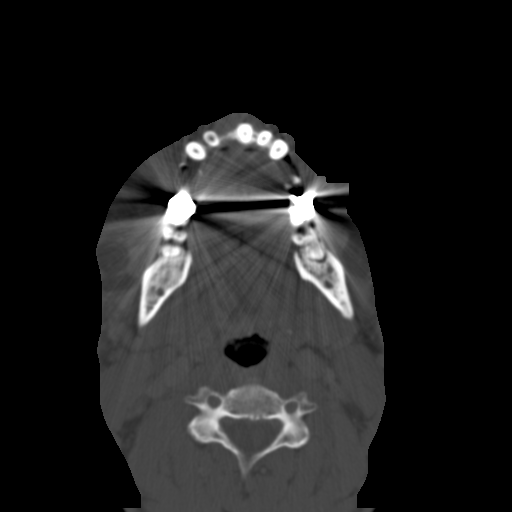
[im 42/93  brain]
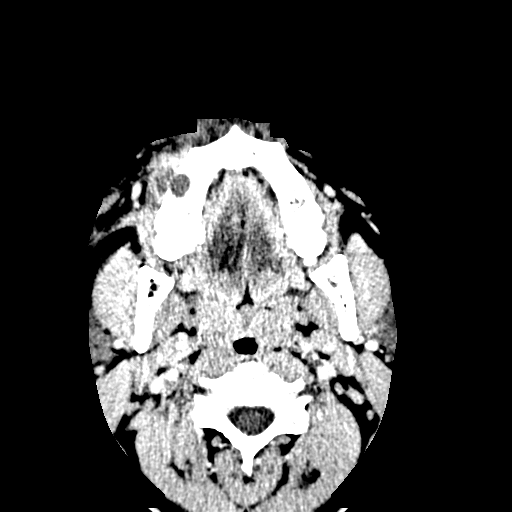
[im 42/93  bone]
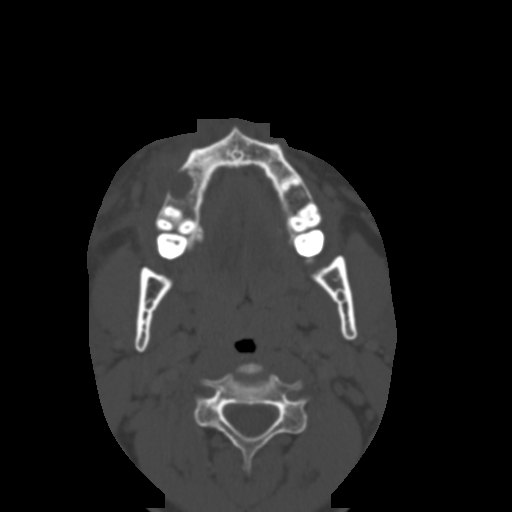
[im 51/93  bone]
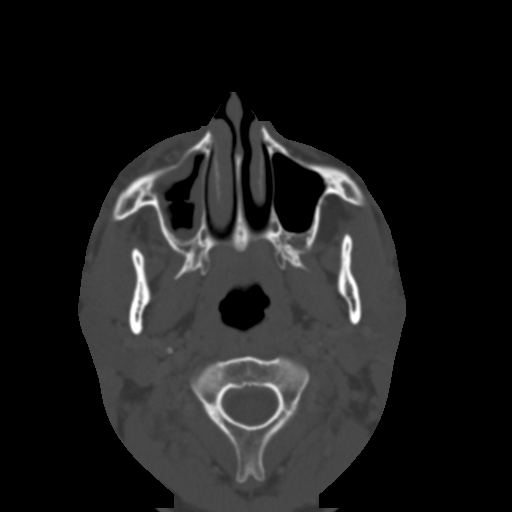
[im 61/93  bone]
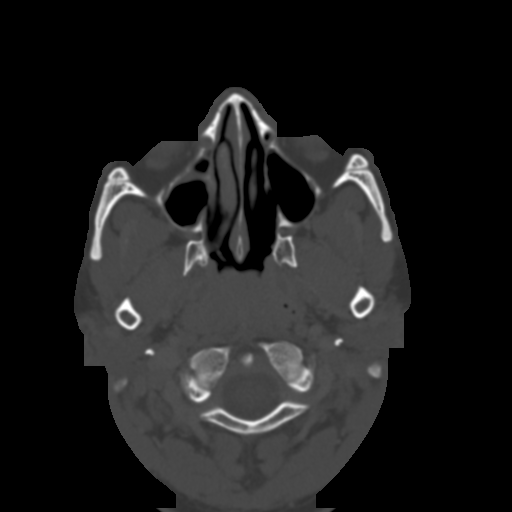
[im 70/93  bone]
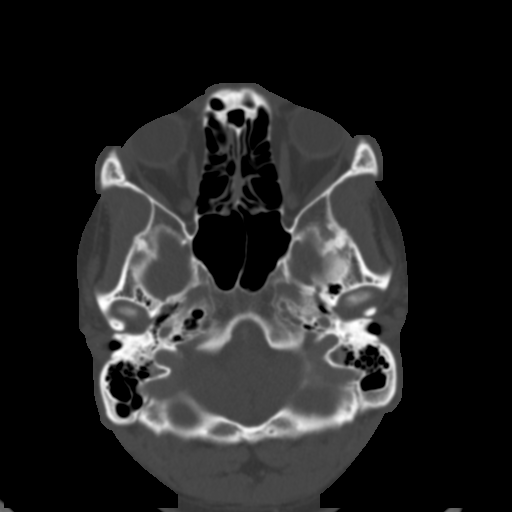
[im 77/93  brain]
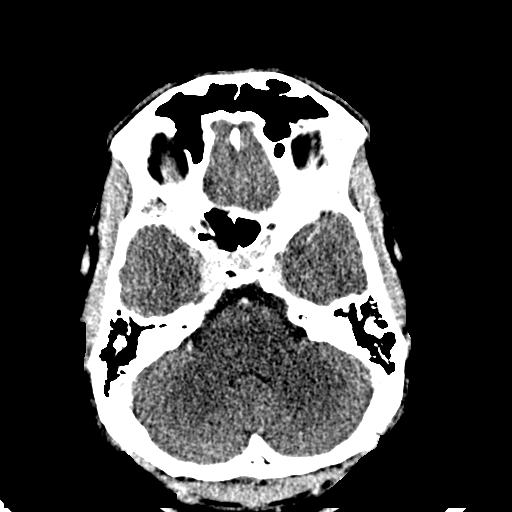
[im 77/93  bone]
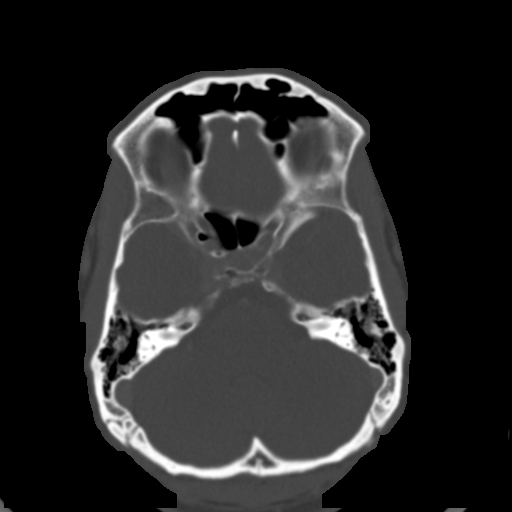
[im 86/93  bone]
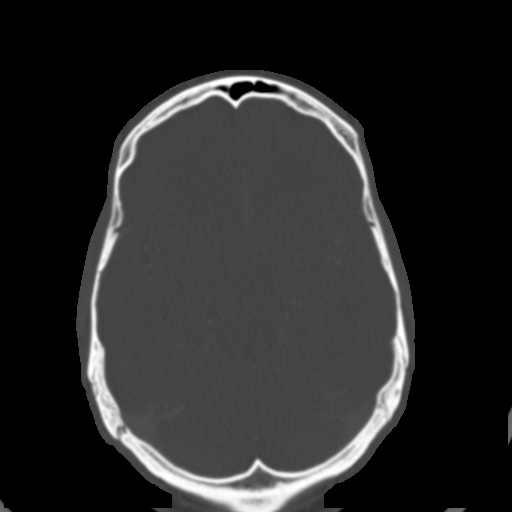

[st coronals · coronal · 0.43mm/px · 3 of 66 slices shown]
[im 22/66  bone]
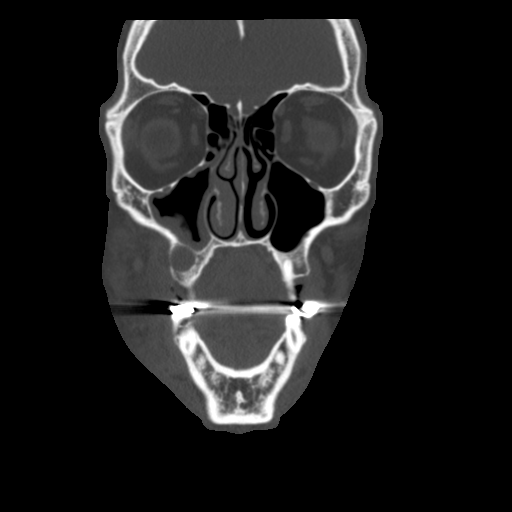
[im 29/66  bone]
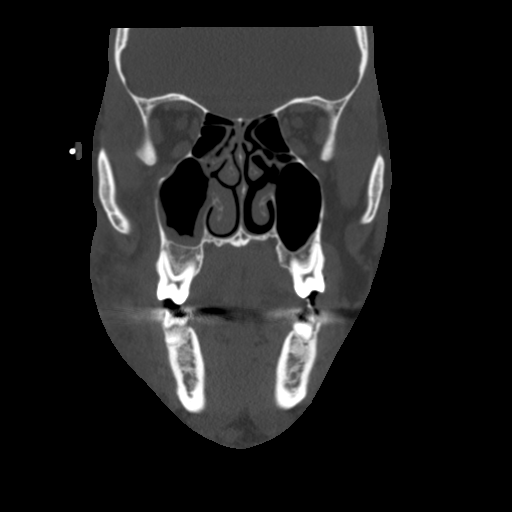
[im 37/66  bone]
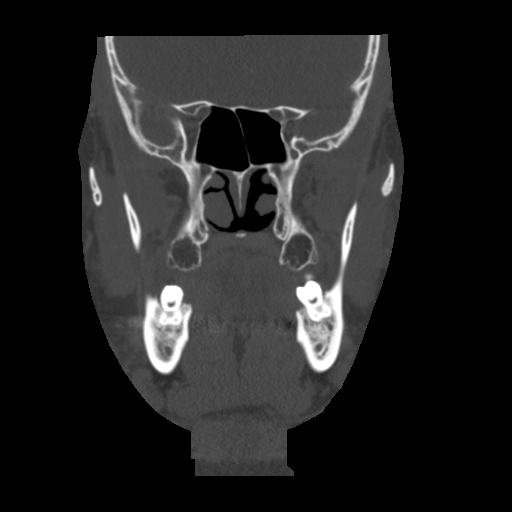

[st sagittals · sagittal · 0.43mm/px · 3 of 76 slices shown]
[im 26/76  bone]
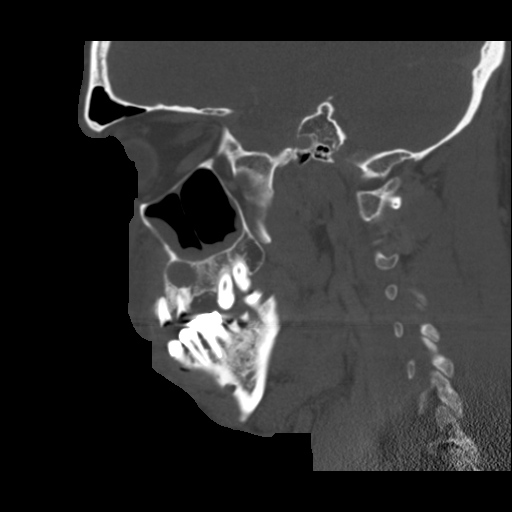
[im 38/76  bone]
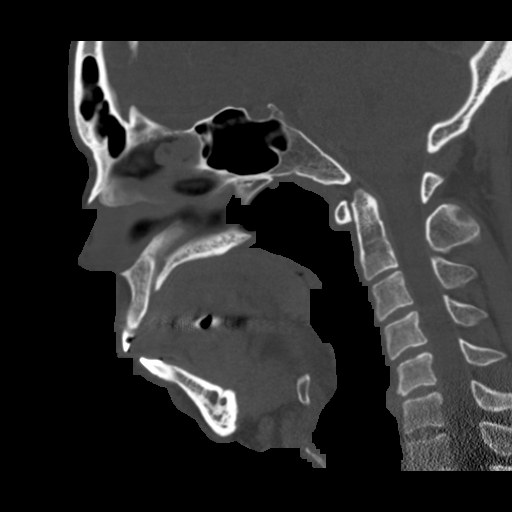
[im 51/76  bone]
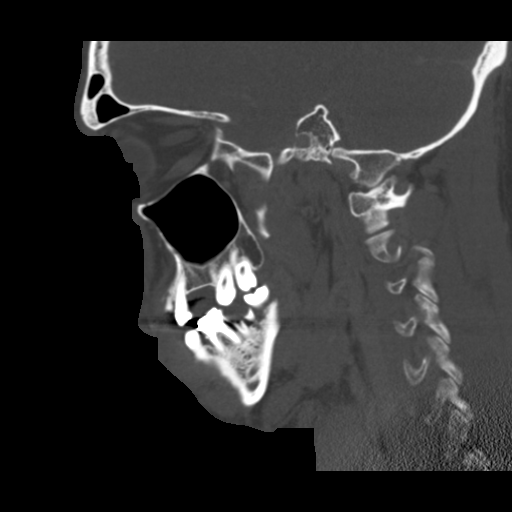

[16 of 47 positions shown; findings below may reference images not displayed]

FINDINGS: There is a 12 mm lytic bone lesion with cortical breakthrough just
above the root of a bicuspid within the right side of the maxilla.
Teres involving right bicuspid teeth are noted. This is compatible
with periapical abscess and osteomyelitis in the maxilla. Fluid
density extends out of the lytic bone lesion and into the overlying
buccal soft tissues consistent with a 1.4 x 1.9 cm abscess.

Teres involving several of the other teeth are noted. Periapical
lucencies around bilateral or bicuspids and molars is compatible
with other areas of periapical abscess about the roots. Mucosal
thickening within the right maxillary sinus is also present.

The airway is patent. Adenoidal tonsillar tissue is moderately
prominent. The visualized larynx is unremarkable. Small cervical
nodes are seen bilaterally. The internal jugular veins are grossly
patent. Carotid arteries are grossly patent. Remainder of the
paranasal sinuses are clear. Mastoid air cells are clear.
IMPRESSION: There is a 1.2 cm lytic bone lesion and osteomyelitis with cortical
breakthrough in the right maxilla. This is associated with a 1.4 x
1.9 cm abscess of the right upper bicuspid. Several areas of dental
caries and periapical abscess involving lower teeth are also noted.

## 2016-04-04 ENCOUNTER — Emergency Department (HOSPITAL_COMMUNITY)
Admission: EM | Admit: 2016-04-04 | Discharge: 2016-04-04 | Disposition: A | Discharge status: Home / Self Care | Payer: Self-pay | Attending: Emergency Medicine | Admitting: Emergency Medicine

## 2016-04-04 ENCOUNTER — Emergency Department (HOSPITAL_COMMUNITY): Payer: Self-pay

## 2016-04-04 ENCOUNTER — Encounter (HOSPITAL_COMMUNITY): Payer: Self-pay | Admitting: Emergency Medicine

## 2016-04-04 DIAGNOSIS — F17210 Nicotine dependence, cigarettes, uncomplicated: Secondary | ICD-10-CM | POA: Insufficient documentation

## 2016-04-04 DIAGNOSIS — N50819 Testicular pain, unspecified: Secondary | ICD-10-CM

## 2016-04-04 DIAGNOSIS — N50811 Right testicular pain: Secondary | ICD-10-CM | POA: Insufficient documentation

## 2016-04-04 LAB — URINALYSIS, ROUTINE W REFLEX MICROSCOPIC
BILIRUBIN URINE: NEGATIVE
GLUCOSE, UA: NEGATIVE mg/dL
HGB URINE DIPSTICK: NEGATIVE
Ketones, ur: NEGATIVE mg/dL
Leukocytes, UA: NEGATIVE
Nitrite: NEGATIVE
PROTEIN: NEGATIVE mg/dL
Specific Gravity, Urine: 1.021 (ref 1.005–1.030)
pH: 5.5 (ref 5.0–8.0)

## 2016-04-04 MED ORDER — CYCLOBENZAPRINE HCL 10 MG PO TABS: 10.0000 mg | ORAL_TABLET | Freq: Two times a day (BID) | ORAL | Status: DC | PRN

## 2016-04-04 MED ORDER — IBUPROFEN 800 MG PO TABS: 800.0000 mg | ORAL_TABLET | Freq: Three times a day (TID) | ORAL | Status: DC

## 2016-04-04 MED ORDER — KETOROLAC TROMETHAMINE 60 MG/2ML IM SOLN
60.0000 mg | Freq: Once | INTRAMUSCULAR | Status: AC
  Administered 2016-04-04: 60 mg via INTRAMUSCULAR
  Filled 2016-04-04: qty 2

## 2016-04-04 NOTE — Discharge Instructions (Signed)
Inguinal Hernia, Adult Muscles help keep everything in the body in its proper place. But if a weak spot in the muscles develops, something can poke through. That is called a hernia. When this happens in the lower part of the belly (abdomen), it is called an inguinal hernia. (It takes its name from a part of the body in this region called the inguinal canal.) A weak spot in the wall of muscles lets some fat or part of the small intestine bulge through. An inguinal hernia can develop at any age. Men get them more often than women. CAUSES  In adults, an inguinal hernia develops over time.  It can be triggered by:  Suddenly straining the muscles of the lower abdomen.  Lifting heavy objects.  Straining to have a bowel movement. Difficult bowel movements (constipation) can lead to this.  Constant coughing. This may be caused by smoking or lung disease.  Being overweight.  Being pregnant.  Working at a job that requires long periods of standing or heavy lifting.  Having had an inguinal hernia before. One type can be an emergency situation. It is called a strangulated inguinal hernia. It develops if part of the small intestine slips through the weak spot and cannot get back into the abdomen. The blood supply can be cut off. If that happens, part of the intestine may die. This situation requires emergency surgery. SYMPTOMS  Often, a small inguinal hernia has no symptoms. It is found when a healthcare provider does a physical exam. Larger hernias usually have symptoms.   In adults, symptoms may include:  A lump in the groin. This is easier to see when the person is standing. It might disappear when lying down.  In men, a lump in the scrotum.  Pain or burning in the groin. This occurs especially when lifting, straining or coughing.  A dull ache or feeling of pressure in the groin.  Signs of a strangulated hernia can include:  A bulge in the groin that becomes very painful and tender to the  touch.  A bulge that turns red or purple.  Fever, nausea and vomiting.  Inability to have a bowel movement or to pass gas. DIAGNOSIS  To decide if you have an inguinal hernia, a healthcare provider will probably do a physical examination.  This will include asking questions about any symptoms you have noticed.  The healthcare provider might feel the groin area and ask you to cough. If an inguinal hernia is felt, the healthcare provider may try to slide it back into the abdomen. Usually no other tests are needed.Back Pain, Adult Back pain is very common in adults.The cause of back pain is rarely dangerous and the pain often gets better over time.The cause of your back pain may not be known. Some common causes of back pain include: Strain of the muscles or ligaments supporting the spine. Wear and tear (degeneration) of the spinal disks. Arthritis. Direct injury to the back. For many people, back pain may return. Since back pain is rarely dangerous, most people can learn to manage this condition on their own. HOME CARE INSTRUCTIONS Watch your back pain for any changes. The following actions may help to lessen any discomfort you are feeling: Remain active. It is stressful on your back to sit or stand in one place for long periods of time. Do not sit, drive, or stand in one place for more than 30 minutes at a time. Take short walks on even surfaces as soon as you  are able.Try to increase the length of time you walk each day. Exercise regularly as directed by your health care provider. Exercise helps your back heal faster. It also helps avoid future injury by keeping your muscles strong and flexible. Do not stay in bed.Resting more than 1-2 days can delay your recovery. Pay attention to your body when you bend and lift. The most comfortable positions are those that put less stress on your recovering back. Always use proper lifting techniques, including: Bending your knees. Keeping the load  close to your body. Avoiding twisting. Find a comfortable position to sleep. Use a firm mattress and lie on your side with your knees slightly bent. If you lie on your back, put a pillow under your knees. Avoid feeling anxious or stressed.Stress increases muscle tension and can worsen back pain.It is important to recognize when you are anxious or stressed and learn ways to manage it, such as with exercise. Take medicines only as directed by your health care provider. Over-the-counter medicines to reduce pain and inflammation are often the most helpful.Your health care provider may prescribe muscle relaxant drugs.These medicines help dull your pain so you can more quickly return to your normal activities and healthy exercise. Apply ice to the injured area: Put ice in a plastic bag. Place a towel between your skin and the bag. Leave the ice on for 20 minutes, 2-3 times a day for the first 2-3 days. After that, ice and heat may be alternated to reduce pain and spasms. Maintain a healthy weight. Excess weight puts extra stress on your back and makes it difficult to maintain good posture. SEEK MEDICAL CARE IF: You have pain that is not relieved with rest or medicine. You have increasing pain going down into the legs or buttocks. You have pain that does not improve in one week. You have night pain. You lose weight. You have a fever or chills. SEEK IMMEDIATE MEDICAL CARE IF:  You develop new bowel or bladder control problems. You have unusual weakness or numbness in your arms or legs. You develop nausea or vomiting. You develop abdominal pain. You feel faint.   This information is not intended to replace advice given to you by your health care provider. Make sure you discuss any questions you have with your health care provider.   Document Released: 09/12/2005 Document Revised: 10/03/2014 Document Reviewed: 01/14/2014 Elsevier Interactive Patient Education 2016 [employer]   TREATMENT    Treatments can vary. The size of the hernia makes a difference. Options include:  Watchful waiting. This is often suggested if the hernia is small and you have had no symptoms.  No medical procedure will be done unless symptoms develop.  You will need to watch closely for symptoms. If any occur, contact your healthcare provider right away.  Surgery. This is used if the hernia is larger or you have symptoms.  Open surgery. This is usually an outpatient procedure (you will not stay overnight in a hospital). An cut (incision) is made through the skin in the groin. The hernia is put back inside the abdomen. The weak area in the muscles is then repaired by herniorrhaphy or hernioplasty. Herniorrhaphy: in this type of surgery, the weak muscles are sewn back together. Hernioplasty: a patch or mesh is used to close the weak area in the abdominal wall.  Laparoscopy. In this procedure, a surgeon makes small incisions. A thin tube with a tiny video camera (called a laparoscope) is put into the abdomen. The surgeon repairs  the hernia with mesh by looking with the video camera and using two long instruments. HOME CARE INSTRUCTIONS   After surgery to repair an inguinal hernia:  You will need to take pain medicine prescribed by your healthcare provider. Follow all directions carefully.  You will need to take care of the wound from the incision.  Your activity will be restricted for awhile. This will probably include no heavy lifting for several weeks. You also should not do anything too active for a few weeks. When you can return to work will depend on the type of job that you have.  During "watchful waiting" periods, you should:  Maintain a healthy weight.  Eat a diet high in fiber (fruits, vegetables and whole grains).  Drink plenty of fluids to avoid constipation. This means drinking enough water and other liquids to keep your urine clear or pale yellow.  Do not lift heavy objects.  Do not  stand for long periods of time.  Quit smoking. This should keep you from developing a frequent cough. SEEK MEDICAL CARE IF:   A bulge develops in your groin area.  You feel pain, a burning sensation or pressure in the groin. This might be worse if you are lifting or straining.  You develop a fever of more than 100.5 F (38.1 C). SEEK IMMEDIATE MEDICAL CARE IF:   Pain in the groin increases suddenly.  A bulge in the groin gets bigger suddenly and does not go down.  For men, there is sudden pain in the scrotum. Or, the size of the scrotum increases.  A bulge in the groin area becomes red or purple and is painful to touch.  You have nausea or vomiting that does not go away.  You feel your heart beating much faster than normal.  You cannot have a bowel movement or pass gas.  You develop a fever of more than 102.0 F (38.9 C).   This information is not intended to replace advice given to you by your health care provider. Make sure you discuss any questions you have with your health care provider.   Document Released: 01/29/2009 Document Revised: 12/05/2011 Document Reviewed: 03/16/2015 Elsevier Interactive Patient Education [employer]

## 2016-04-04 NOTE — ED Notes (Signed)
Pt c/o lower back pain radiating to the right groin since Wednesday when patient was lifting furniture at work. Pt denies urinary sx.

## 2016-04-04 NOTE — ED Provider Notes (Signed)
CSN: [id number]     Arrival date & time 04/04/16  1709 History  By signing my name below, I, Tanda Rockers, attest that this documentation has been prepared under the direction and in the presence of Cari Vandeberg, PA-C. Electronically Signed: Tanda Rockers, ED Scribe. 04/04/2016. 5:28 PM.   Chief Complaint  Patient presents with  . Back Pain   The history is provided by the patient. No language interpreter was used.    HPI Comments: Andrew Keller is a 30 y.o. male who presents to the Emergency Department complaining of sudden onset, intermittent, sharp, bilateral lower back pain x 6 days with acute worsening today. Pt reports that he was lifting furniture at work 6 days ago and on his way back up from picking up furniture he began having immediate pain and a "catch" in his back. Pt mentions that the pain was gradually improving but he was in his yard today and lifted something heavy causing worsening pain to the back. The pain began radiating to his right groin and testicle today after he lifted the heavy object in his yard, prompting him to come to the ED. The pain is exacerbated with movement of the spine and ambulation. Denies new masses in the scrotum. No hx hernias or kidney stones. Pt works as a Special educational needs teacher. Denies fevers, hematuria, dysuria, penile discharge, leg pain, weakness, numbness, urinary or bowel incontinence, saddle anesthesia, or any other associated symptoms.   History reviewed. No pertinent past medical history. Past Surgical History  Procedure Laterality Date  . Multiple extractions with alveoloplasty N/A 07/05/2013    Procedure: MULTIPLE EXTRACTION OF TOOTH #'S [phone] WITH ALVEOLOPLASTY A ND INCISION AND DRAINAGE OF THE MAXILLARY RIGHT BUCCAL VESTIBULE AREA 4-5.;  Surgeon: Charlynne Pander, DDS;  Location: [facility] OR;  Service: Oral Surgery;  Laterality: N/A;   History reviewed. No pertinent family history. Social History  Substance Use Topics  . Smoking  status: Current Every Day Smoker -- 1.00 packs/day for 10 years    Types: Cigarettes  . Smokeless tobacco: None  . Alcohol Use: 2.0 - 3.0 oz/week    4-6 drink(s) per week    Review of Systems  Genitourinary: Positive for testicular pain. Negative for dysuria and hematuria.       + Groin pain  Musculoskeletal: Positive for back pain.  Neurological: Negative for weakness and numbness.  All other systems reviewed and are negative.  Allergies  Penicillins  Home Medications   Prior to Admission medications   Medication Sig Start Date End Date Taking? Authorizing Provider  azithromycin (ZITHROMAX) 250 MG tablet Take 1 tablet (250 mg total) by mouth daily. 04/18/15   Renne Crigler, PA-C  cyclobenzaprine (FLEXERIL) 10 MG tablet Take 1 tablet (10 mg total) by mouth 2 (two) times daily as needed for muscle spasms. 04/04/16   Lillianah Swartzentruber, PA-C  ibuprofen (ADVIL,MOTRIN) 800 MG tablet Take 1 tablet (800 mg total) by mouth 3 (three) times daily. 04/04/16   Kinan Safley, PA-C  naproxen (NAPROSYN) 500 MG tablet Take 1 tablet (500 mg total) by mouth 2 (two) times daily. 04/18/15   Renne Crigler, PA-C   BP 124/76 mmHg  Pulse 81  Temp(Src) 98.2 F (36.8 C) (Oral)  Resp 16  SpO2 99%   Physical Exam  Constitutional: He appears well-developed and well-nourished. No distress.  Nontoxic appearing  HENT:  Head: Normocephalic and atraumatic.  Eyes: Conjunctivae are normal. Right eye exhibits no discharge. Left eye exhibits no discharge. No scleral icterus.  Neck:  Normal range of motion.  Cardiovascular: Normal rate, regular rhythm and intact distal pulses.   Pulmonary/Chest: Effort normal. No respiratory distress.  Abdominal: Soft. He exhibits no distension. There is no tenderness. Hernia confirmed negative in the right inguinal area and confirmed negative in the left inguinal area.  Genitourinary: Penis normal. Right testis shows tenderness. Right testis shows no swelling. Circumcised.  Chaperoned  by nurse. TTP of the right scrotum and testicle. No obvious hernia palpated with pt standing or with valsalva. No swelling of the testicles.   Musculoskeletal: Normal range of motion.       Lumbar back: He exhibits tenderness, pain and spasm. He exhibits normal range of motion, no edema and no deformity.  Generalized TTP of the lumbar region with spasm in the right lumbar paraspinal musculature. No focal midline TTP. No bony deformities or step offs. FROM of the spine intact. FROM of the BLE intact and pt ambulates with a slow, steady gait.   Neurological: He is alert. Coordination normal.  5/5 strength of the BLE. Sensation to light touch intact throughout.   Skin: Skin is warm and dry.  Psychiatric: He has a normal mood and affect. His behavior is normal.  Nursing note and vitals reviewed.   ED Course  Procedures (including critical care time)  DIAGNOSTIC STUDIES: Oxygen Saturation is 99% on RA, normal by my interpretation.    COORDINATION OF CARE: 5:28 PM-Discussed treatment plan with pt at bedside and pt agreed to plan.   Labs Review Labs Reviewed  URINALYSIS, ROUTINE W REFLEX MICROSCOPIC (NOT AT [facility])    Imaging Review US Scrotum  04/04/2016  CLINICAL DATA:  Back pain for 6 days radiating to the right groin EXAM: SCROTAL ULTRASOUND DOPPLER ULTRASOUND OF THE TESTICLES TECHNIQUE: Complete ultrasound examination of the testicles, epididymis, and other scrotal structures was performed. Color and spectral Doppler ultrasound were also utilized to evaluate blood flow to the testicles. COMPARISON:  None. FINDINGS: Right testicle Measurements: 5.3 x 2.5 x 3.7 cm. No mass or microlithiasis visualized. Left testicle Measurements: 5.4 x 2.7 x 3 cm. No mass. 1 mm calcification in the left testicle likely reflecting dystrophic calcification. Right epididymis:  Normal in size and appearance. Left epididymis:  Normal in size and appearance. Hydrocele:  None visualized. Varicocele:  None visualized.  Pulsed Doppler interrogation of both testes demonstrates normal low resistance arterial and venous waveforms bilaterally. IMPRESSION: 1. No testicular torsion. 2. No testicular mass. Electronically Signed   By: Elige Ko   On: 04/04/2016 19:58   [place] Art/ven Flow Abd Pelv Doppler  04/04/2016  CLINICAL DATA:  Back pain for 6 days radiating to the right groin EXAM: SCROTAL ULTRASOUND DOPPLER ULTRASOUND OF THE TESTICLES TECHNIQUE: Complete ultrasound examination of the testicles, epididymis, and other scrotal structures was performed. Color and spectral Doppler ultrasound were also utilized to evaluate blood flow to the testicles. COMPARISON:  None. FINDINGS: Right testicle Measurements: 5.3 x 2.5 x 3.7 cm. No mass or microlithiasis visualized. Left testicle Measurements: 5.4 x 2.7 x 3 cm. No mass. 1 mm calcification in the left testicle likely reflecting dystrophic calcification. Right epididymis:  Normal in size and appearance. Left epididymis:  Normal in size and appearance. Hydrocele:  None visualized. Varicocele:  None visualized. Pulsed Doppler interrogation of both testes demonstrates normal low resistance arterial and venous waveforms bilaterally. IMPRESSION: 1. No testicular torsion. 2. No testicular mass. Electronically Signed   By: Elige Ko   On: 04/04/2016 19:58   I have personally reviewed and  evaluated these images and lab results as part of my medical decision-making.   EKG Interpretation None      MDM   Final diagnoses:  Testicular pain   30 year old male presenting with back pain x 1 week and acute worsening with testicular pain x 1 day. Generalized TTP of lumbar region without focal spiny tenderness. FROM of spine intact. Pt remains ambulatory with some discomfort. Non-focal neuro exam. Tenderness of the right scrotum and testicle on exam (chaperoned by nurse Mickel FuchsKellee). No swelling. No mass or hernia on examination of right inguinal canal. UA negative for blood or infection. Given  tenderness of right testicle, US of scrotum ordered. Toradol given for pain.  US of scrotum negative. Discussed with pt that back pain is likely MSK in nature from recurrent heavy lifting. Possibility that pt has very small inguinal hernia that is unable to be palpated on exam. Will discharge with heavy lifting restrictions, ibuprofen and muscle relaxer. Instructed pt to follow up with PCP in the next few days for a recheck. I have also discussed signs and symptoms to return immediately to the ER with. Pt states understanding and is stable for discharge.     I personally performed the services described in this documentation, which was scribed in my presence. The recorded information has been reviewed and is accurate.     Rolm GalaStevi Breanne Olvera, PA-C 04/04/16 2011  Kharter Sestak, PA-C 04/04/16 2039  Maia PlanJoshua G Long, MD 04/04/16 [id number]

## 2017-10-16 IMAGING — US US SCROTUM
1 series · 14 of 25 positions shown · non-contrast
Comparison: None.

CLINICAL DATA: Back pain for 6 days radiating to the right groin

EXAM:
SCROTAL ULTRASOUND
DOPPLER ULTRASOUND OF THE TESTICLES
TECHNIQUE: Complete ultrasound examination of the testicles, epididymis, and
other scrotal structures was performed. Color and spectral Doppler
ultrasound were also utilized to evaluate blood flow to the
testicles.

[Series 1: us scrotum · 0.07mm/px · 14 of 38 slices shown]
[im 1/38]
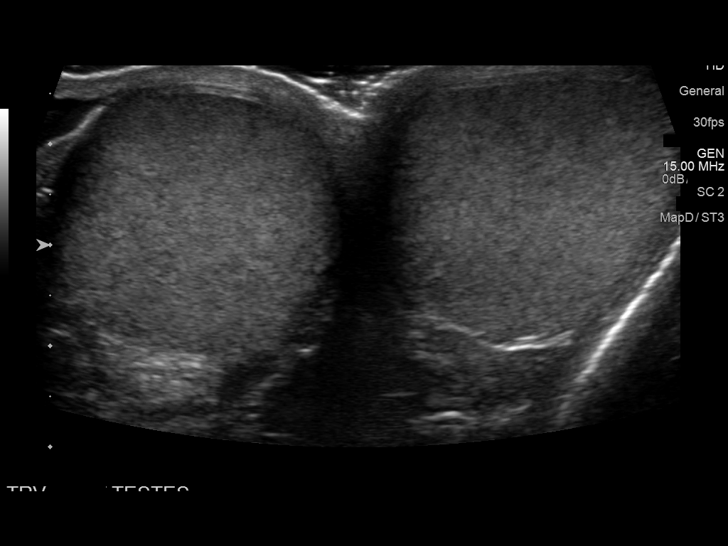
[im 4/38]
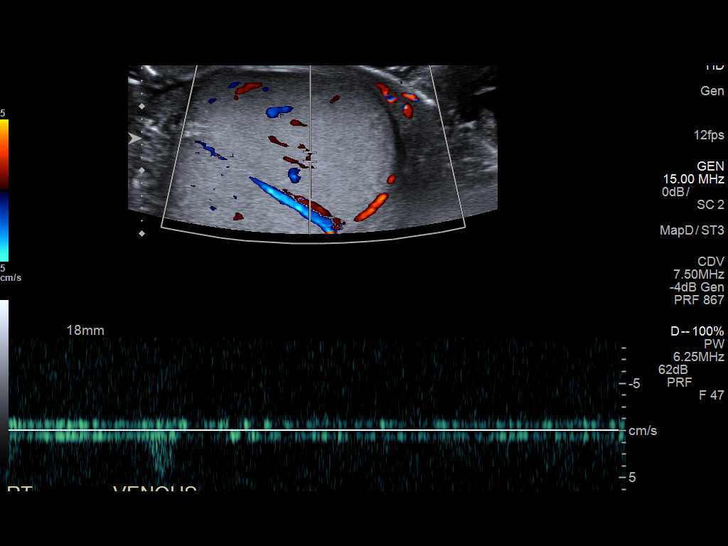
[im 7/38]
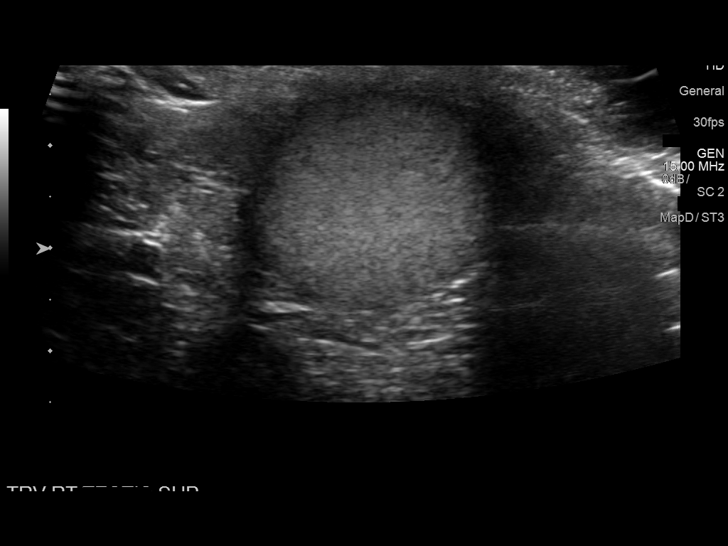
[im 10/38]
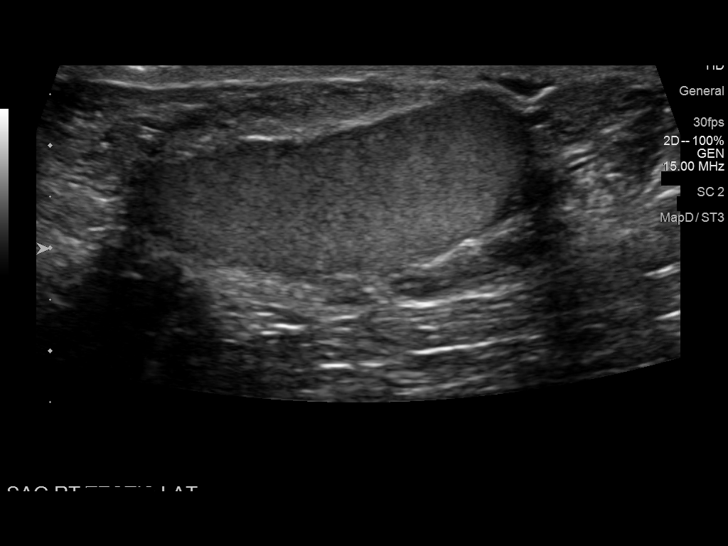
[im 13/38]
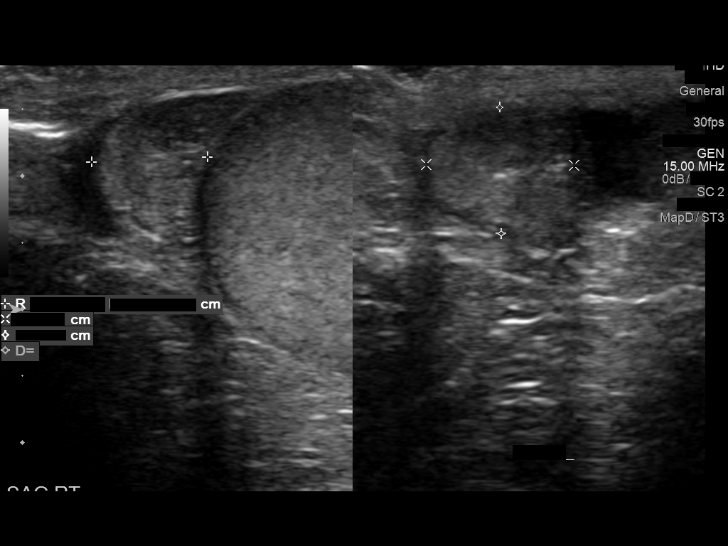
[im 14/38]
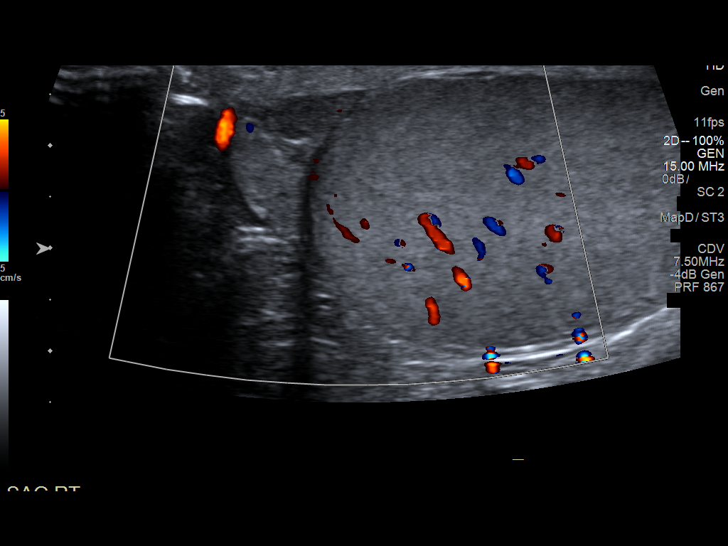
[im 17/38]
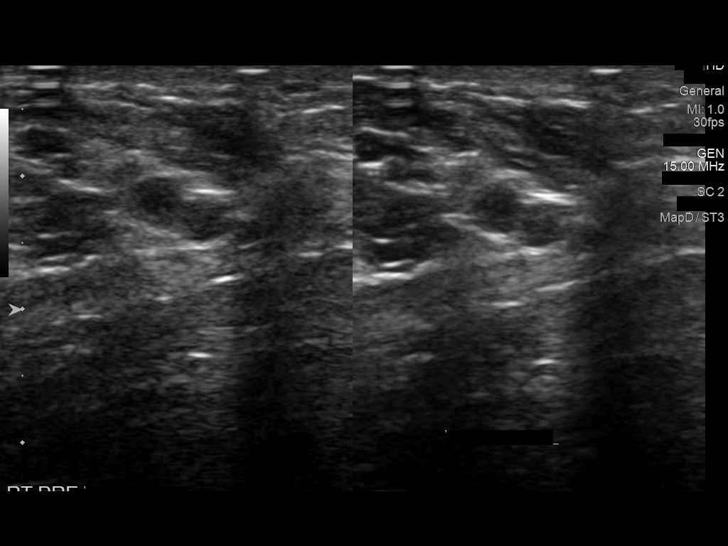
[im 21/38]
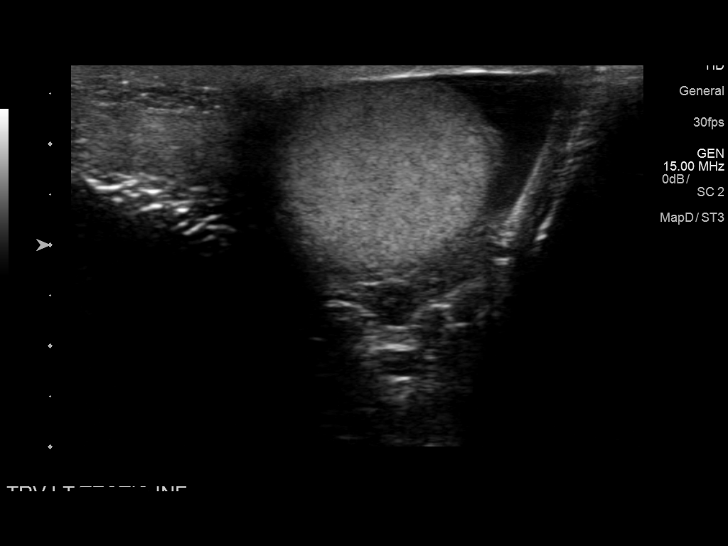
[im 24/38]
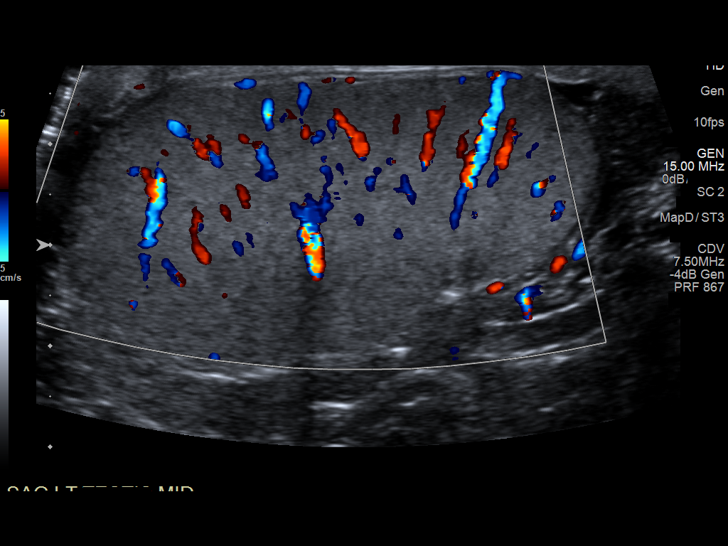
[im 25/38]
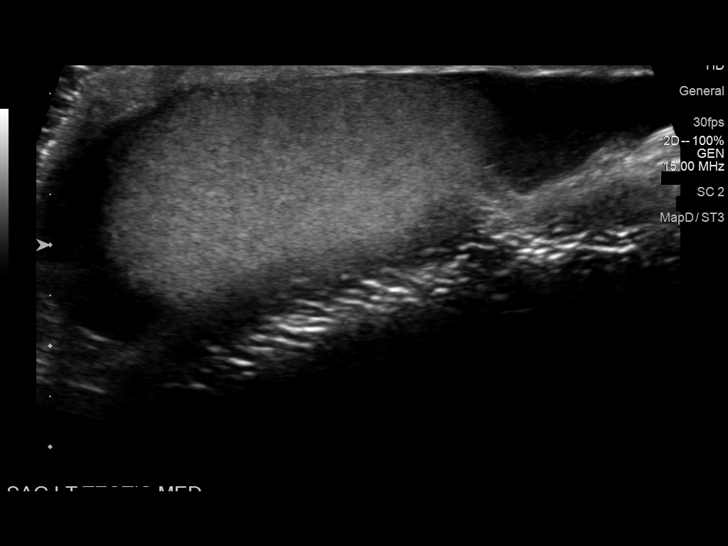
[im 28/38]
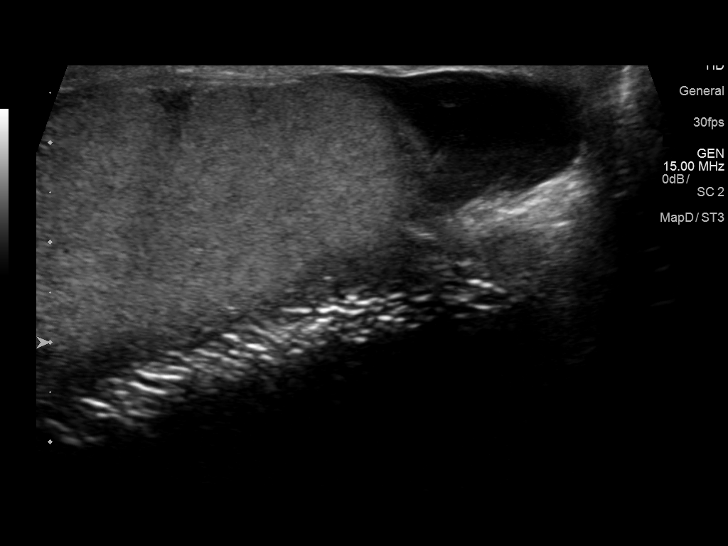
[im 31/38]
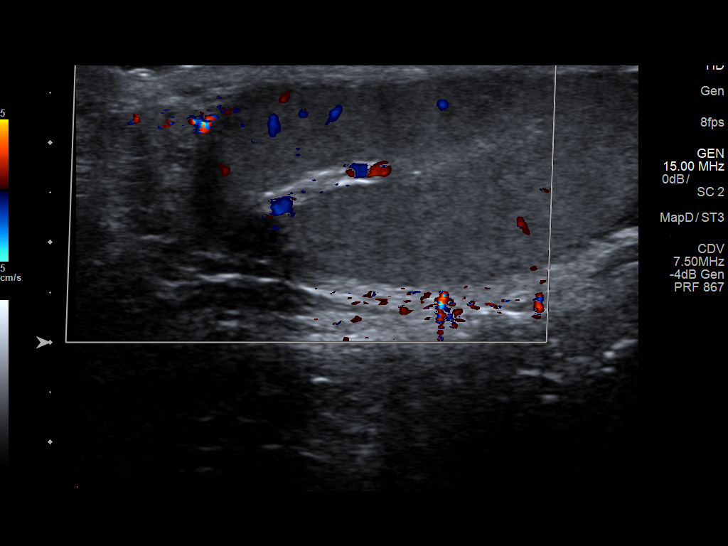
[im 34/38]
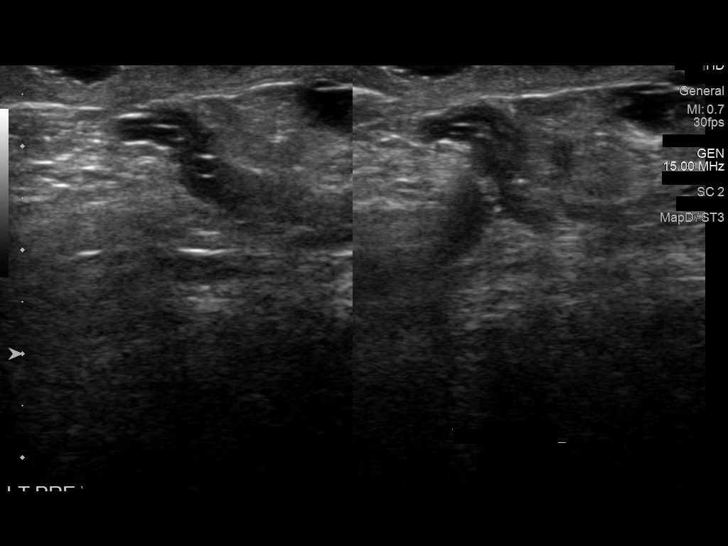
[im 38/38]
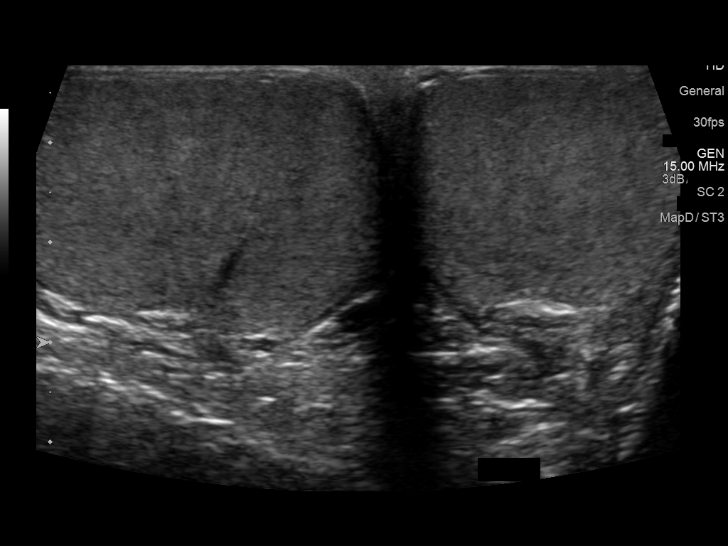

[14 of 25 positions shown; findings below may reference images not displayed]

FINDINGS: Right testicle

Measurements: 5.3 x 2.5 x 3.7 cm. No mass or microlithiasis
visualized.

Left testicle

Measurements: 5.4 x 2.7 x 3 cm. No mass. 1 mm calcification in the
left testicle likely reflecting dystrophic calcification.

Right epididymis:  Normal in size and appearance.

Left epididymis:  Normal in size and appearance.

Hydrocele:  None visualized.

Varicocele:  None visualized.

Pulsed Doppler interrogation of both testes demonstrates normal low
resistance arterial and venous waveforms bilaterally.
IMPRESSION: 1. No testicular torsion.
2. No testicular mass.

## 2018-01-09 ENCOUNTER — Other Ambulatory Visit: Payer: Self-pay

## 2018-01-09 ENCOUNTER — Encounter (HOSPITAL_COMMUNITY): Payer: Self-pay | Admitting: Emergency Medicine

## 2018-01-09 ENCOUNTER — Emergency Department (HOSPITAL_COMMUNITY)
Admission: EM | Admit: 2018-01-09 | Discharge: 2018-01-09 | Disposition: A | Discharge status: Home / Self Care | Payer: Self-pay | Attending: Emergency Medicine | Admitting: Emergency Medicine

## 2018-01-09 DIAGNOSIS — F17210 Nicotine dependence, cigarettes, uncomplicated: Secondary | ICD-10-CM | POA: Insufficient documentation

## 2018-01-09 DIAGNOSIS — K0889 Other specified disorders of teeth and supporting structures: Secondary | ICD-10-CM | POA: Insufficient documentation

## 2018-01-09 MED ORDER — HYDROCODONE-ACETAMINOPHEN 5-325 MG PO TABS
1.0000 | ORAL_TABLET | Freq: Once | ORAL | Status: AC
  Administered 2018-01-09: 1 via ORAL
  Filled 2018-01-09: qty 1

## 2018-01-09 MED ORDER — CLINDAMYCIN HCL 300 MG PO CAPS: 300.0000 mg | ORAL_CAPSULE | Freq: Two times a day (BID) | ORAL | 0 refills | Status: DC

## 2018-01-09 MED ORDER — NAPROXEN 375 MG PO TABS: 375.0000 mg | ORAL_TABLET | Freq: Two times a day (BID) | ORAL | 0 refills | Status: DC

## 2018-01-09 NOTE — ED Triage Notes (Signed)
Pt c/o dental pain on left side x's 1 week unsure which tooth

## 2018-01-09 NOTE — ED Provider Notes (Signed)
[facility] EMERGENCY DEPARTMENT Provider Note   CSN: [id number] Arrival date & time: 01/09/18  1904     History   Chief Complaint Chief Complaint  Patient presents with  . Dental Pain    HPI Andrew Keller is a 32 y.o. male.  Patient complaining of dental pain to the left lower jaw, onset one week ago. Patient reports allergy to penicillin. He does not have a dentist or dental coverage through his job.  The history is provided by the patient. No language interpreter was used.  Dental Pain   This is a new problem. The current episode started more than 1 week ago. The problem occurs constantly. The problem has not changed since onset.The pain is moderate.    History reviewed. No pertinent past medical history.  Patient Active Problem List   Diagnosis Date Noted  . 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 Dental abscess 07/03/2013    Past Surgical History:  Procedure Laterality Date  . MULTIPLE EXTRACTIONS WITH ALVEOLOPLASTY N/A 07/05/2013   Procedure: MULTIPLE EXTRACTION OF TOOTH #'S [phone] WITH ALVEOLOPLASTY A ND INCISION AND DRAINAGE OF THE MAXILLARY RIGHT BUCCAL VESTIBULE AREA 4-5.;  Surgeon: Charlynne Pander, DDS;  Location: [facility] OR;  Service: Oral Surgery;  Laterality: N/A;        Home Medications    Prior to Admission medications   Medication Sig Start Date End Date Taking? Authorizing Provider  azithromycin (ZITHROMAX) 250 MG tablet Take 1 tablet (250 mg total) by mouth daily. 04/18/15   Renne Crigler, PA-C  cyclobenzaprine (FLEXERIL) 10 MG tablet Take 1 tablet (10 mg total) by mouth 2 (two) times daily as needed for muscle spasms. 04/04/16   Barrett, Rolm Gala, PA-C  ibuprofen (ADVIL,MOTRIN) 800 MG tablet Take 1 tablet (800 mg total) by mouth 3 (three) times daily. 04/04/16   Barrett, Rolm Gala, PA-C  naproxen (NAPROSYN) 500 MG tablet Take 1 tablet (500 mg total) by mouth 2 (two) times daily. 04/18/15   Renne Crigler, PA-C    Family  History No family history on file.  Social History Social History   Tobacco Use  . Smoking status: Current Every Day Smoker    Packs/day: 1.00    Years: 10.00    Pack years: 10.00    Types: Cigarettes  . Smokeless tobacco: Never Used  Substance Use Topics  . Alcohol use: Yes    Alcohol/week: 2.0 - 3.0 oz    Types: 4 - 6 Standard drinks or equivalent per week  . Drug use: No     Allergies   Penicillins   Review of Systems Review of Systems  HENT: Positive for dental problem.   All other systems reviewed and are negative.    Physical Exam Updated Vital Signs BP (!) 151/83 (BP Location: Right Arm)   Pulse 89   Temp 97.7 F (36.5 C) (Oral)   Ht 5\' 11"  (1.803 m)   Wt 96.2 kg (212 lb)   SpO2 100%   BMI 29.57 kg/m   Physical Exam  Constitutional: He is oriented to person, place, and time. He appears well-developed and well-nourished.  HENT:  Head: Normocephalic.  Mouth/Throat: No trismus in the jaw. Dental caries present.  Eyes: Conjunctivae are normal.  Neck: Neck supple.  Cardiovascular: Normal rate and regular rhythm.  Pulmonary/Chest: Effort normal and breath sounds normal.  Abdominal: Soft. Bowel sounds are normal.  Musculoskeletal: Normal range of motion.  Lymphadenopathy:    He has no cervical adenopathy.  Neurological: He is  alert and oriented to person, place, and time.  Skin: Skin is warm and dry.  Psychiatric: He has a normal mood and affect.  Nursing note and vitals reviewed.    ED Treatments / Results  Labs (all labs ordered are listed, but only abnormal results are displayed) Labs Reviewed - No data to display  EKG None  Radiology No results found.  Procedures Procedures (including critical care time)  Medications Ordered in ED Medications  HYDROcodone-acetaminophen (NORCO/VICODIN) 5-325 MG per tablet 1 tablet (1 tablet Oral Given 01/09/18 2135)     Initial Impression / Assessment and Plan / ED Course  I have reviewed the  triage vital signs and the nursing notes.  Pertinent labs & imaging results that were available during my care of the patient were reviewed by me and considered in my medical decision making (see chart for details).     Patient with dentalgia.  No abscess requiring immediate incision and drainage.  Exam not concerning for Ludwig's angina or pharyngeal abscess.  Will treat with clindamycin and naprosyn. Pt instructed to follow-up with dentist.  Discussed return precautions. Pt safe for discharge.  Final Clinical Impressions(s) / ED Diagnoses   Final diagnoses:  Pain, dental    ED Discharge Orders        Ordered    naproxen (NAPROSYN) 375 MG tablet  2 times daily     01/09/18 2139    clindamycin (CLEOCIN) 300 MG capsule  2 times daily     01/09/18 2139       Felicie MornSmith, Graciella Arment, NP 01/09/18 2145    Wynetta FinesMessick, Peter C, MD 01/10/18 [phone]

## 2020-07-08 ENCOUNTER — Encounter (HOSPITAL_COMMUNITY): Payer: Self-pay | Admitting: Emergency Medicine

## 2020-07-08 ENCOUNTER — Ambulatory Visit (HOSPITAL_COMMUNITY)
Admission: EM | Admit: 2020-07-08 | Discharge: 2020-07-08 | Disposition: A | Discharge status: Home / Self Care | Payer: Self-pay | Attending: Internal Medicine | Admitting: Internal Medicine

## 2020-07-08 ENCOUNTER — Other Ambulatory Visit: Payer: Self-pay

## 2020-07-08 DIAGNOSIS — Z23 Encounter for immunization: Secondary | ICD-10-CM

## 2020-07-08 DIAGNOSIS — S61432A Puncture wound without foreign body of left hand, initial encounter: Secondary | ICD-10-CM

## 2020-07-08 MED ORDER — LEVOFLOXACIN 750 MG PO TABS: 750.0000 mg | ORAL_TABLET | Freq: Every day | ORAL | 0 refills | Status: AC

## 2020-07-08 MED ORDER — KETOROLAC TROMETHAMINE 30 MG/ML IJ SOLN
INTRAMUSCULAR | Status: AC
  Filled 2020-07-08: qty 1

## 2020-07-08 MED ORDER — TETANUS-DIPHTH-ACELL PERTUSSIS 5-2.5-18.5 LF-MCG/0.5 IM SUSP
0.5000 mL | Freq: Once | INTRAMUSCULAR | Status: AC
  Administered 2020-07-08: 0.5 mL via INTRAMUSCULAR

## 2020-07-08 MED ORDER — TETANUS-DIPHTH-ACELL PERTUSSIS 5-2.5-18.5 LF-MCG/0.5 IM SUSP
INTRAMUSCULAR | Status: AC
  Filled 2020-07-08: qty 0.5

## 2020-07-08 MED ORDER — KETOROLAC TROMETHAMINE 30 MG/ML IJ SOLN
30.0000 mg | Freq: Once | INTRAMUSCULAR | Status: AC
  Administered 2020-07-08: 30 mg via INTRAMUSCULAR

## 2020-07-08 MED ORDER — IBUPROFEN 600 MG PO TABS: 600.0000 mg | ORAL_TABLET | Freq: Four times a day (QID) | ORAL | 0 refills | Status: DC | PRN

## 2020-07-08 NOTE — ED Triage Notes (Signed)
Pt presents with left hand injury. States was unloading lumber and stuck with rusty nail.

## 2020-07-08 NOTE — ED Provider Notes (Addendum)
[facility]    CSN: [id number] Arrival date & time: 07/08/20  1654      History   Chief Complaint Chief Complaint  Patient presents with  . Hand Pain    HPI Andrew Keller is a 34 y.o. male comes to the urgent care with left hand injury.  Patient was unloading lumber from his truck when he sustained a puncture wound from a rusty nail.  Patient cleaned the wound and try to control the bleeding by applying pressure to the puncture wound.  Eventually bleeding was controlled.  Patient has now received tetanus vaccination in the past 10 years.  No numbness or tingling in the fingers.  No discoloration of the fingers.Marland Kitchen   HPI  History reviewed. No pertinent past medical history.  Patient Active Problem List   Diagnosis Date Noted  . 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 Dental abscess 07/03/2013    Past Surgical History:  Procedure Laterality Date  . MULTIPLE EXTRACTIONS WITH ALVEOLOPLASTY N/A 07/05/2013   Procedure: MULTIPLE EXTRACTION OF TOOTH #'S [phone] WITH ALVEOLOPLASTY A ND INCISION AND DRAINAGE OF THE MAXILLARY RIGHT BUCCAL VESTIBULE AREA 4-5.;  Surgeon: Charlynne Pander, DDS;  Location: [facility];  Service: Oral Surgery;  Laterality: N/A;       Home Medications    Prior to Admission medications   Medication Sig Start Date End Date Taking? Authorizing Provider  cyclobenzaprine (FLEXERIL) 10 MG tablet Take 1 tablet (10 mg total) by mouth 2 (two) times daily as needed for muscle spasms. 04/04/16   Barrett, Rolm Gala, PA-C  ibuprofen (ADVIL) 600 MG tablet Take 1 tablet (600 mg total) by mouth every 6 (six) hours as needed. 07/08/20   Merrilee Jansky, MD  levofloxacin (LEVAQUIN) 750 MG tablet Take 1 tablet (750 mg total) by mouth daily for 3 days. 07/08/20 07/11/20  Merrilee Jansky, MD  naproxen (NAPROSYN) 375 MG tablet Take 1 tablet (375 mg total) by mouth 2 (two) times daily. 01/09/18   Felicie Morn, NP    Family History History reviewed. No  pertinent family history.  Social History Social History   Tobacco Use  . Smoking status: Current Every Day Smoker    Packs/day: 1.00    Years: 10.00    Pack years: 10.00    Types: Cigarettes  . Smokeless tobacco: Never Used  Substance Use Topics  . Alcohol use: Yes    Alcohol/week: 4.0 - 6.0 standard drinks    Types: 4 - 6 Standard drinks or equivalent per week  . Drug use: No     Allergies   Penicillins   Review of Systems Review of Systems Per HPI.   Physical Exam Triage Vital Signs ED Triage Vitals  Enc Vitals Group     BP 07/08/20 1707 126/84     Pulse Rate 07/08/20 1707 71     Resp 07/08/20 1707 16     Temp 07/08/20 1707 98.3 F (36.8 C)     Temp Source 07/08/20 1707 Oral     SpO2 07/08/20 1707 98 %     Weight --      Height --      Head Circumference --      Peak Flow --      Pain Score 07/08/20 1705 8     Pain Loc --      Pain Edu? --      Excl. in GC? --    No data found.  Updated Vital Signs BP 126/84 (BP  Location: Right Arm)   Pulse 71   Temp 98.3 F (36.8 C) (Oral)   Resp 16   SpO2 98%   Visual Acuity Right Eye Distance:   Left Eye Distance:   Bilateral Distance:    Right Eye Near:   Left Eye Near:    Bilateral Near:     Physical Exam Vitals and nursing note reviewed.  Constitutional:      General: He is not in acute distress.    Appearance: Normal appearance. He is not ill-appearing.  Skin:    Capillary Refill: Capillary refill takes less than 2 seconds.     Comments: Puncture wound in the palm of the left hand.  Bleeding is controlled.  Neurological:     Mental Status: He is alert.      UC Treatments / Results  Labs (all labs ordered are listed, but only abnormal results are displayed) Labs Reviewed - No data to display  EKG   Radiology No results found.  Procedures Procedures (including critical care time)  Medications Ordered in UC Medications  Tdap (BOOSTRIX) injection 0.5 mL (0.5 mLs Intramuscular Given  07/08/20 1828)  ketorolac (TORADOL) 30 MG/ML injection 30 mg (30 mg Intramuscular Given 07/08/20 1827)    Initial Impression / Assessment and Plan / UC Course  I have reviewed the triage vital signs and the nursing notes.  Pertinent labs & imaging results that were available during my care of the patient were reviewed by me and considered in my medical decision making (see chart for details).     1.  Puncture wound of the left palm: Tetanus update Levaquin 750 mg orally daily for 3 days-antibiotic prophylaxis Ibuprofen 600 mg every 6 hours as needed for pain Return precautions given If patient notices increased bleeding, swelling, pain or purulent discharge she has been advised to return to urgent care immediately to be reevaluated.   Daily wound dressing changes Final Clinical Impressions(s) / UC Diagnoses   Final diagnoses:  Puncture wound of left palm, initial encounter   Discharge Instructions   None    ED Prescriptions    Medication Sig Dispense Auth. Provider   levofloxacin (LEVAQUIN) 750 MG tablet Take 1 tablet (750 mg total) by mouth daily for 3 days. 3 tablet Aja Bolander, Britta Mccreedy, MD   ibuprofen (ADVIL) 600 MG tablet Take 1 tablet (600 mg total) by mouth every 6 (six) hours as needed. 30 tablet Keilee Denman, Britta Mccreedy, MD     PDMP not reviewed this encounter.   Merrilee Jansky, MD 07/08/20 Nira Retort, MD 07/08/20 1924

## 2021-05-07 ENCOUNTER — Ambulatory Visit (HOSPITAL_COMMUNITY)
Admission: EM | Admit: 2021-05-07 | Discharge: 2021-05-07 | Disposition: A | Discharge status: Home / Self Care | Payer: Self-pay | Attending: Physician Assistant | Admitting: Physician Assistant

## 2021-05-07 ENCOUNTER — Encounter (HOSPITAL_COMMUNITY): Payer: Self-pay | Admitting: *Deleted

## 2021-05-07 ENCOUNTER — Other Ambulatory Visit: Payer: Self-pay

## 2021-05-07 DIAGNOSIS — T7840XA Allergy, unspecified, initial encounter: Secondary | ICD-10-CM

## 2021-05-07 DIAGNOSIS — T63441A Toxic effect of venom of bees, accidental (unintentional), initial encounter: Secondary | ICD-10-CM

## 2021-05-07 MED ORDER — PREDNISONE 10 MG (21) PO TBPK: ORAL_TABLET | ORAL | 0 refills | Status: DC

## 2021-05-07 MED ORDER — FAMOTIDINE 20 MG PO TABS
ORAL_TABLET | ORAL | Status: AC
  Filled 2021-05-07: qty 2

## 2021-05-07 MED ORDER — EPINEPHRINE 0.3 MG/0.3ML IJ SOAJ: 0.3000 mg | INTRAMUSCULAR | 0 refills | Status: DC | PRN

## 2021-05-07 MED ORDER — METHYLPREDNISOLONE SODIUM SUCC 125 MG IJ SOLR
125.0000 mg | Freq: Once | INTRAMUSCULAR | Status: AC
  Administered 2021-05-07: 125 mg via INTRAMUSCULAR

## 2021-05-07 MED ORDER — EPINEPHRINE PF 1 MG/ML IJ SOLN
INTRAMUSCULAR | Status: AC
  Filled 2021-05-07: qty 1

## 2021-05-07 MED ORDER — DIPHENHYDRAMINE HCL 50 MG/ML IJ SOLN
INTRAMUSCULAR | Status: AC
  Filled 2021-05-07: qty 1

## 2021-05-07 MED ORDER — METHYLPREDNISOLONE SODIUM SUCC 125 MG IJ SOLR
INTRAMUSCULAR | Status: AC
  Filled 2021-05-07: qty 2

## 2021-05-07 MED ORDER — DIPHENHYDRAMINE HCL 50 MG/ML IJ SOLN
50.0000 mg | Freq: Once | INTRAMUSCULAR | Status: AC
  Administered 2021-05-07: 50 mg via INTRAMUSCULAR

## 2021-05-07 MED ORDER — IBUPROFEN 800 MG PO TABS
800.0000 mg | ORAL_TABLET | Freq: Once | ORAL | Status: AC
  Administered 2021-05-07: 800 mg via ORAL

## 2021-05-07 MED ORDER — FAMOTIDINE 20 MG PO TABS
40.0000 mg | ORAL_TABLET | Freq: Once | ORAL | Status: AC
  Administered 2021-05-07: 40 mg via ORAL

## 2021-05-07 MED ORDER — IBUPROFEN 800 MG PO TABS
ORAL_TABLET | ORAL | Status: AC
  Filled 2021-05-07: qty 1

## 2021-05-07 NOTE — ED Triage Notes (Signed)
Pt reports getting stung 5x yesterday to multiple sites.  C/O feeling like heart is racing and SOB.  Denies any sensation of throat or oral swelling.  HR regular in 70s.  Swelling noted to left hand and right ear lobe.  States took Benadryl last night without relief. Provider notified of above.

## 2021-05-07 NOTE — ED Provider Notes (Signed)
He reported improvement as [facility]    CSN: [id number] Arrival date & time: 05/07/21  0808      History   Chief Complaint Chief Complaint  Patient presents with   Insect Bite   Allergic Reaction    HPI Andrew Keller is a 35 y.o. male.   Patient presents today after being stung multiple times yesterday by bees or wasps.  Reports that he was stung in his ear, left arm, left leg.  He denies history of anaphylaxis or other severe allergies.  He did take Benadryl last night and reports that symptoms improved but did not resolve.  He does report shortness of breath and subjective palpitations.  He denies any nausea, vomiting, lightheadedness.  He has not taken any epinephrine.  He denies any muffled voice, difficulty swallowing, sore throat, swelling of face/throat.  He denies any nausea, vomiting, lightheadedness, dizziness.   History reviewed. No pertinent past medical history.  Patient Active Problem List   Diagnosis Date Noted   Acute osteomyelitis of maxilla 07/03/2013   Dental abscess 07/03/2013    Past Surgical History:  Procedure Laterality Date   MULTIPLE EXTRACTIONS WITH ALVEOLOPLASTY N/A 07/05/2013   Procedure: MULTIPLE EXTRACTION OF TOOTH #'S [phone] WITH ALVEOLOPLASTY A ND INCISION AND DRAINAGE OF THE MAXILLARY RIGHT BUCCAL VESTIBULE AREA 4-5.;  Surgeon: Charlynne Pander, DDS;  Location: [facility];  Service: Oral Surgery;  Laterality: N/A;       Home Medications    Prior to Admission medications   Medication Sig Start Date End Date Taking? Authorizing Provider  EPINEPHrine 0.3 mg/0.3 mL IJ SOAJ injection Inject 0.3 mg into the muscle as needed for anaphylaxis. 05/07/21  Yes Nalina Yeatman K, PA-C  predniSONE (STERAPRED UNI-PAK 21 TAB) 10 MG (21) TBPK tablet As directed 05/07/21  Yes Deondrea Markos, Noberto Retort, PA-C    Family History Family History  Problem Relation Age of Onset   Healthy Mother    Healthy Father     Social History Social History    Tobacco Use   Smoking status: Every Day    Packs/day: 1.00    Years: 10.00    Pack years: 10.00    Types: Cigarettes   Smokeless tobacco: Never  Vaping Use   Vaping Use: Never used  Substance Use Topics   Alcohol use: Yes    Alcohol/week: 4.0 - 6.0 standard drinks    Types: 4 - 6 Standard drinks or equivalent per week   Drug use: No     Allergies   Bee venom and Penicillins   Review of Systems Review of Systems  Constitutional:  Negative for activity change, appetite change, fatigue and fever.  HENT:  Negative for sore throat, trouble swallowing and voice change.   Respiratory:  Positive for shortness of breath. Negative for cough.   Cardiovascular:  Positive for palpitations. Negative for chest pain and leg swelling.  Gastrointestinal:  Negative for abdominal pain, diarrhea, nausea and vomiting.  Musculoskeletal:  Negative for arthralgias and myalgias.  Skin:  Positive for color change. Negative for wound.  Neurological:  Negative for dizziness, light-headedness and headaches.    Physical Exam Triage Vital Signs ED Triage Vitals  Enc Vitals Group     BP 05/07/21 0817 124/84     Pulse Rate 05/07/21 0817 79     Resp 05/07/21 0817 18     Temp 05/07/21 0817 98.7 F (37.1 C)     Temp src --      SpO2 05/07/21 0817  97 %     Weight --      Height --      Head Circumference --      Peak Flow --      Pain Score 05/07/21 0820 10     Pain Loc --      Pain Edu? --      Excl. in GC? --    No data found.  Updated Vital Signs BP 120/77   Pulse 63   Temp 98.7 F (37.1 C)   Resp 18   SpO2 97%   Visual Acuity Right Eye Distance:   Left Eye Distance:   Bilateral Distance:    Right Eye Near:   Left Eye Near:    Bilateral Near:     Physical Exam Vitals reviewed.  Constitutional:      General: He is awake.     Appearance: Normal appearance. He is normal weight. He is not ill-appearing.     Comments: Very pleasant male appears stated age sitting comfortably  in exam room in no acute distress  HENT:     Head: Normocephalic and atraumatic.     Mouth/Throat:     Pharynx: Uvula midline. No oropharyngeal exudate or posterior oropharyngeal erythema.     Tonsils: 1+ on the right. 1+ on the left.     Comments: Normal-appearing posterior oropharynx Cardiovascular:     Rate and Rhythm: Normal rate and regular rhythm.     Heart sounds: Normal heart sounds, S1 normal and S2 normal. No murmur heard. Pulmonary:     Effort: Pulmonary effort is normal.     Breath sounds: Normal breath sounds. No stridor. No wheezing, rhonchi or rales.     Comments: Clear to auscultation bilaterally Abdominal:     General: Bowel sounds are normal.     Palpations: Abdomen is soft.     Tenderness: There is no abdominal tenderness.  Skin:    Comments: Several areas of erythema with associated swelling noted right ear and neck, left knee, left flank.  Neurological:     Mental Status: He is alert.  Psychiatric:        Behavior: Behavior is cooperative.     UC Treatments / Results  Labs (all labs ordered are listed, but only abnormal results are displayed) Labs Reviewed - No data to display  EKG   Radiology No results found.  Procedures Procedures (including critical care time)  Medications Ordered in UC Medications  methylPREDNISolone sodium succinate (SOLU-MEDROL) 125 mg/2 mL injection 125 mg (125 mg Intramuscular Given 05/07/21 0833)  famotidine (PEPCID) tablet 40 mg (40 mg Oral Given 05/07/21 0833)  diphenhydrAMINE (BENADRYL) injection 50 mg (50 mg Intramuscular Given 05/07/21 0833)  ibuprofen (ADVIL) tablet 800 mg (800 mg Oral Given 05/07/21 0919)    Initial Impression / Assessment and Plan / UC Course  I have reviewed the triage vital signs and the nursing notes.  Pertinent labs & imaging results that were available during my care of the patient were reviewed by me and considered in my medical decision making (see chart for details).      Vital signs  and physical exam are reassuring.  Patient was given 50 mg of Benadryl, 40 mg of Pepcid, 125 mg of Solu-Medrol in clinic.  He continued to have pain due to swelling and so was given 800 mg of ibuprofen.  Patient was monitored for over an hour and vital signs remained stable.  He denied any feelings of throat closing, shortness of breath,  muffled voice.  Patient was discharged home with instruction to alternate H1 and H2 blocker as well start prednisone taper tomorrow.  He was instructed not to take NSAIDs with prednisone due to risk of GI bleeding.  Discussed that if he requires use of EpiPen or has any worsening symptoms he needs to go to the emergency room immediately.  Recommended he follow-up with primary care within 1 week to ensure symptom improvement.  Discussed alarm symptoms that warrant emergent evaluation.  Strict return precautions given to which patient expressed understanding.  Final Clinical Impressions(s) / UC Diagnoses   Final diagnoses:  Allergic reaction, initial encounter  Bee sting, accidental or unintentional, initial encounter     Discharge Instructions      Start prednisone taper tomorrow.  You can continue alternating antihistamines including Zyrtec (10 milligrams) in the morning and Pepcid 40 mg at night to help with symptoms.  You are prescribed an EpiPen if you have any difficulty breathing or feeling like your throat is swelling you need to use this medication go to the emergency room immediately.  If you have any worsening symptoms including shortness of breath, change in voice, chest pain, nausea, vomiting, lightheadedness you need to go to the ER.     ED Prescriptions     Medication Sig Dispense Auth. Provider   EPINEPHrine 0.3 mg/0.3 mL IJ SOAJ injection Inject 0.3 mg into the muscle as needed for anaphylaxis. 1 each Latasia Silberstein K, PA-C   predniSONE (STERAPRED UNI-PAK 21 TAB) 10 MG (21) TBPK tablet As directed 21 tablet Lizania Bouchard K, PA-C      PDMP not  reviewed this encounter.   Jeani Hawking, PA-C 05/07/21 0930

## 2021-05-07 NOTE — Discharge Instructions (Addendum)
Start prednisone taper tomorrow.  You can continue alternating antihistamines including Zyrtec (10 milligrams) in the morning and Pepcid 40 mg at night to help with symptoms.  You are prescribed an EpiPen if you have any difficulty breathing or feeling like your throat is swelling you need to use this medication go to the emergency room immediately.  If you have any worsening symptoms including shortness of breath, change in voice, chest pain, nausea, vomiting, lightheadedness you need to go to the ER.

## 2023-02-03 ENCOUNTER — Ambulatory Visit (HOSPITAL_COMMUNITY)
Admission: EM | Admit: 2023-02-03 | Discharge: 2023-02-03 | Disposition: A | Discharge status: Home / Self Care | Payer: Commercial Managed Care - HMO | Attending: Family Medicine | Admitting: Family Medicine

## 2023-02-03 ENCOUNTER — Encounter (HOSPITAL_COMMUNITY): Payer: Self-pay

## 2023-02-03 DIAGNOSIS — K047 Periapical abscess without sinus: Secondary | ICD-10-CM | POA: Diagnosis not present

## 2023-02-03 MED ORDER — EPINEPHRINE 0.3 MG/0.3ML IJ SOAJ: 0.3000 mg | INTRAMUSCULAR | 0 refills | Status: AC | PRN

## 2023-02-03 MED ORDER — KETOROLAC TROMETHAMINE 30 MG/ML IJ SOLN
INTRAMUSCULAR | Status: AC
  Filled 2023-02-03: qty 1

## 2023-02-03 MED ORDER — CLINDAMYCIN HCL 300 MG PO CAPS: 300.0000 mg | ORAL_CAPSULE | Freq: Three times a day (TID) | ORAL | 0 refills | Status: AC

## 2023-02-03 MED ORDER — KETOROLAC TROMETHAMINE 30 MG/ML IJ SOLN
30.0000 mg | Freq: Once | INTRAMUSCULAR | Status: AC
  Administered 2023-02-03: 30 mg via INTRAMUSCULAR

## 2023-02-03 MED ORDER — IBUPROFEN 800 MG PO TABS: 800.0000 mg | ORAL_TABLET | Freq: Three times a day (TID) | ORAL | 0 refills | Status: DC | PRN

## 2023-02-03 NOTE — ED Triage Notes (Signed)
Patient reports that he has a right lower dental abscess.  Patient states he has taken Excedrin at 1530.

## 2023-02-03 NOTE — ED Provider Notes (Signed)
[facility]    CSN: [id number] Arrival date & time: 02/03/23  1745      History   Chief Complaint Chief Complaint  Patient presents with   Dental Pain    HPI Andrew Keller is a 37 y.o. male.    Dental Pain  Here for pain in his right lower jaw and right lower teeth posteriorly.  Began bothering about 2 days ago.  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  He states he does have a dentist  History reviewed. No pertinent past medical history.  Patient Active Problem List   Diagnosis Date Noted   Acute osteomyelitis of maxilla 07/03/2013   Dental abscess 07/03/2013    Past Surgical History:  Procedure Laterality Date   MULTIPLE EXTRACTIONS WITH ALVEOLOPLASTY N/A 07/05/2013   Procedure: MULTIPLE EXTRACTION OF TOOTH #'S [phone] WITH ALVEOLOPLASTY A ND INCISION AND DRAINAGE OF THE MAXILLARY RIGHT BUCCAL VESTIBULE AREA 4-5.;  Surgeon: Charlynne Pander, DDS;  Location: [facility];  Service: Oral Surgery;  Laterality: N/A;       Home Medications    Prior to Admission medications   Medication Sig Start Date End Date Taking? Authorizing Provider  clindamycin (CLEOCIN) 300 MG capsule Take 1 capsule (300 mg total) by mouth 3 (three) times daily for 7 days. 02/03/23 02/10/23 Yes Zenia Resides, MD  ibuprofen (ADVIL) 800 MG tablet Take 1 tablet (800 mg total) by mouth every 8 (eight) hours as needed (pain). 02/03/23  Yes Zenia Resides, MD  EPINEPHrine 0.3 mg/0.3 mL IJ SOAJ injection Inject 0.3 mg into the muscle as needed for anaphylaxis. 02/03/23   Zenia Resides, MD    Family History Family History  Problem Relation Age of Onset   Healthy Mother    Healthy Father     Social History Social History   Tobacco Use   Smoking status: Every Day    Packs/day: 0.35    Years: 10.00    Additional pack years: 0.00    Total pack years: 3.50    Types: Cigarettes   Smokeless  tobacco: Never  Vaping Use   Vaping Use: Never used  Substance Use Topics   Alcohol use: Yes    Alcohol/week: 4.0 - 6.0 standard drinks of alcohol    Types: 4 - 6 Standard drinks or equivalent per week   Drug use: No     Allergies   Bee venom and Penicillins   Review of Systems Review of Systems   Physical Exam Triage Vital Signs ED Triage Vitals  Enc Vitals Group     BP 02/03/23 1812 132/81     Pulse Rate 02/03/23 1812 76     Resp 02/03/23 1812 16     Temp 02/03/23 1812 99 F (37.2 C)     Temp Source 02/03/23 1812 Oral     SpO2 02/03/23 1812 94 %     Weight --      Height --      Head Circumference --      Peak Flow --      Pain Score 02/03/23 1814 10     Pain Loc --      Pain Edu? --      Excl. in GC? --    No data found.  Updated Vital Signs BP 132/81 (BP Location: Left Arm)   Pulse 76   Temp 99 F (37.2 C) (Oral)  Resp 16   SpO2 94%   Visual Acuity Right Eye Distance:   Left Eye Distance:   Bilateral Distance:    Right Eye Near:   Left Eye Near:    Bilateral Near:     Physical Exam Vitals reviewed.  Constitutional:      General: He is not in acute distress.    Appearance: He is not ill-appearing, toxic-appearing or diaphoretic.  HENT:     Mouth/Throat:     Mouth: Mucous membranes are moist.     Comments: There is a broken carious tooth on the right lower dental ridge at the back.  There is no drainage inside the mouth.  The external jaw is tender.  No fluctuance. Eyes:     Extraocular Movements: Extraocular movements intact.     Pupils: Pupils are equal, round, and reactive to light.  Cardiovascular:     Rate and Rhythm: Normal rate and regular rhythm.     Heart sounds: No murmur heard. Pulmonary:     Effort: Pulmonary effort is normal.     Breath sounds: Normal breath sounds.  Skin:    Coloration: Skin is not pale.  Neurological:     General: No focal deficit present.     Mental Status: He is alert and oriented to person, place,  and time.  Psychiatric:        Behavior: Behavior normal.      UC Treatments / Results  Labs (all labs ordered are listed, but only abnormal results are displayed) Labs Reviewed - No data to display  EKG   Radiology No results found.  Procedures Procedures (including critical care time)  Medications Ordered in UC Medications  ketorolac (TORADOL) 30 MG/ML injection 30 mg (has no administration in time range)    Initial Impression / Assessment and Plan / UC Course  I have reviewed the triage vital signs and the nursing notes.  Pertinent labs & imaging results that were available during my care of the patient were reviewed by me and considered in my medical decision making (see chart for details).        Clindamycin is sent in for the dental infection and ibuprofen is sent in for the pain.  Toradol is given here.  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  You have been given a shot of Toradol 30 mg today.  EpiPen was also sent in for your bee allergy  Follow-up with a dentist in a later time     ED Prescriptions     Medication Sig Dispense Auth. Provider   clindamycin (CLEOCIN) 300 MG capsule Take 1 capsule (300 mg total) by mouth 3 (three) times daily for 7 days. 21 capsule Zenia Resides, MD   ibuprofen (ADVIL) 800 MG tablet Take 1 tablet (800 mg total) by mouth every 8 (eight) hours as needed (pain). 21 tablet Zenia Resides, MD   EPINEPHrine 0.3 mg/0.3 mL IJ SOAJ injection Inject 0.3 mg into the muscle as needed for anaphylaxis. 1 each Zenia Resides, MD      I have reviewed the PDMP during this encounter.   Zenia Resides, MD 02/03/23 [phone]

## 2023-02-03 NOTE — Discharge Instructions (Addendum)
--  Take clindamycin 300 mg-- 1 capsule 3 times daily for 7 days  Take ibuprofen 800 mg--1 tab every 8 hours as needed for pain.  You have been given a shot of Toradol 30 mg today.  EpiPen was also sent in for your bee allergy  Follow-up with a dentist in a later time

## 2024-07-25 ENCOUNTER — Ambulatory Visit (HOSPITAL_COMMUNITY)
Admission: EM | Admit: 2024-07-25 | Discharge: 2024-07-25 | Disposition: A | Discharge status: Home / Self Care | Payer: Self-pay | Attending: Internal Medicine | Admitting: Internal Medicine

## 2024-07-25 ENCOUNTER — Encounter (HOSPITAL_COMMUNITY): Payer: Self-pay | Admitting: *Deleted

## 2024-07-25 DIAGNOSIS — K0889 Other specified disorders of teeth and supporting structures: Secondary | ICD-10-CM

## 2024-07-25 DIAGNOSIS — K047 Periapical abscess without sinus: Secondary | ICD-10-CM

## 2024-07-25 MED ORDER — IBUPROFEN 800 MG PO TABS: 800.0000 mg | ORAL_TABLET | Freq: Three times a day (TID) | ORAL | 0 refills | Status: AC | PRN

## 2024-07-25 MED ORDER — HYDROCODONE-ACETAMINOPHEN 5-325 MG PO TABS: 1.0000 | ORAL_TABLET | Freq: Four times a day (QID) | ORAL | 0 refills | Status: AC | PRN

## 2024-07-25 MED ORDER — CLINDAMYCIN HCL 300 MG PO CAPS: 300.0000 mg | ORAL_CAPSULE | Freq: Three times a day (TID) | ORAL | 0 refills | Status: AC

## 2024-07-25 MED ORDER — KETOROLAC TROMETHAMINE 30 MG/ML IJ SOLN
30.0000 mg | Freq: Once | INTRAMUSCULAR | Status: AC
  Administered 2024-07-25: 30 mg via INTRAMUSCULAR

## 2024-07-25 MED ORDER — KETOROLAC TROMETHAMINE 30 MG/ML IJ SOLN
INTRAMUSCULAR | Status: AC
  Filled 2024-07-25: qty 1

## 2024-07-25 NOTE — ED Provider Notes (Signed)
 [facility]    CSN: [id number] Arrival date & time: 07/25/24  9183      History   Chief Complaint Chief Complaint  Patient presents with   Dental Pain    HPI Andrew Keller is a 38 y.o. male.   38 year old male presents urgent care with complaints of left lower jaw pain.  This started about a week ago.  He has a tooth that is decaying in that area and is scheduled to see a dentist next month but reports that he has developed worsening pain and swelling in the area.  He denies any fevers or chills.  He has been taking over-the-counter medication for pain without relief.   Dental Pain Associated symptoms: no fever     History reviewed. No pertinent past medical history.  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 Authorizing Provider  clindamycin  (CLEOCIN ) 300 MG capsule Take 1 capsule (300 mg total) by mouth 3 (three) times daily for 7 days. 07/25/24 08/01/24 Yes Ozie Lupe A, PA-C  HYDROcodone -acetaminophen  (NORCO/VICODIN) 5-325 MG tablet Take 1-2 tablets by mouth every 6 (six) hours as needed for severe pain (pain score 7-10). 07/25/24  Yes Arleene Settle A, PA-C  EPINEPHrine  0.3 mg/0.3 mL IJ SOAJ injection Inject 0.3 mg into the muscle as needed for anaphylaxis. 02/03/23   Vonna Sharlet POUR, MD  ibuprofen  (ADVIL ) 800 MG tablet Take 1 tablet (800 mg total) by mouth every 8 (eight) hours as needed for moderate  pain (pain score 4-6) (pain). 07/25/24   Teresa Almarie LABOR, PA-C    Family History Family History  Problem Relation Age of Onset   Healthy Mother    Healthy Father     Social History Social History   Tobacco Use   Smoking status: Every Day    Current packs/day: 0.35    Average packs/day: 0.4 packs/day for 10.0 years (3.5 ttl pk-yrs)    Types: Cigarettes   Smokeless tobacco: Never  Vaping Use   Vaping status: Never Used  Substance Use Topics   Alcohol use: Yes    Alcohol/week: 4.0 - 6.0 standard drinks of alcohol    Types: 4 - 6 Standard drinks or equivalent per week   Drug use: No     Allergies   Bee venom and Penicillins   Review of Systems Review of Systems  Constitutional:  Negative for chills and fever.  HENT:  Positive for dental problem. Negative for ear pain and sore throat.   Eyes:  Negative for pain and visual disturbance.  Respiratory:  Negative for cough and shortness of breath.   Cardiovascular:  Negative for chest pain and palpitations.  Gastrointestinal:  Negative for abdominal pain and vomiting.  Genitourinary:  Negative for dysuria and hematuria.  Musculoskeletal:  Negative for arthralgias and back pain.  Skin:  Negative for color change and rash.  Neurological:  Negative for seizures and syncope.  All other systems reviewed and are  negative.    Physical Exam Triage Vital Signs ED Triage Vitals  Encounter Vitals Group     BP 07/25/24 0831 125/80     Girls Systolic BP Percentile --      Girls Diastolic BP Percentile --      Boys Systolic BP Percentile --      Boys Diastolic BP Percentile --      Pulse Rate 07/25/24 0831 69     Resp 07/25/24 0831 16     Temp 07/25/24 0831 98.1 F (36.7 C)     Temp Source 07/25/24 0831 Oral     SpO2 07/25/24 0831 98 %     Weight --      Height --      Head Circumference --      Peak Flow --      Pain Score 07/25/24 0830 10     Pain Loc --      Pain Education --      Exclude from Growth Chart --     No data found.  Updated Vital Signs BP 125/80 (BP Location: Right Arm)   Pulse 69   Temp 98.1 F (36.7 C) (Oral)   Resp 16   SpO2 98%   Visual Acuity Right Eye Distance:   Left Eye Distance:   Bilateral Distance:    Right Eye Near:   Left Eye Near:    Bilateral Near:     Physical Exam Vitals and nursing note reviewed.  Constitutional:      General: He is not in acute distress.    Appearance: He is well-developed.  HENT:     Head: Normocephalic and atraumatic.     Mouth/Throat:   Eyes:     Conjunctiva/sclera: Conjunctivae normal.  Cardiovascular:     Rate and Rhythm: Normal rate and regular rhythm.     Heart sounds: No murmur heard. Pulmonary:     Effort: Pulmonary effort is normal. No respiratory distress.     Breath sounds: Normal breath sounds.  Abdominal:     Palpations: Abdomen is soft.     Tenderness: There is no abdominal tenderness.  Musculoskeletal:        General: No swelling.     Cervical back: Neck supple.  Skin:    General: Skin is warm and dry.     Capillary Refill: Capillary refill takes less than 2 seconds.  Neurological:     Mental Status: He is alert.  Psychiatric:        Mood and Affect: Mood normal.      UC Treatments / Results  Labs (all labs ordered are listed, but only abnormal results are displayed) Labs Reviewed - No data to display  EKG   Radiology No results found.  Procedures Procedures (including critical care time)  Medications Ordered in UC Medications  ketorolac  (TORADOL ) 30 MG/ML injection 30 mg (has no administration in time range)    Initial Impression / Assessment and Plan / UC Course  I have reviewed the triage vital signs and the nursing notes.  Pertinent labs & imaging results that were available during my care of the patient were reviewed by me and considered in my medical decision making (see chart for details).     Dental infection  Pain, dental   Left lower dental infection.  Will start  antibiotics and call in medication for moderate and severe pain.  Keep follow-up appointment as scheduled with dentist for next month. Toradol  injection given today. This is a medication  to help with pain. This is not a narcotic. Do not take ibuprofen  today. Start 07/26/24 Ibuprofen  800 mg every 8 hours as needed for moderate pain. Hydrocodone /acetaminophen  5/325 mg 1-2 tablets every 6 hours as needed for severe pain. Use caution as this medication can cause sedation. Do not drive while taking this medication.  Do not take products containing Tylenol  while you are taking this medication. Clindamycin  300 mg 3 times daily for 7 days. Take this with food. This is an antibiotic.  Make sure to stay hydrated by drinking plenty of water. Keep follow-up appointment as scheduled with dentist Return to urgent care or PCP if symptoms worsen or fail to resolve.    Final Clinical Impressions(s) / UC Diagnoses   Final diagnoses:  Dental infection  Pain, dental     Discharge Instructions      Left lower dental infection.  Will start antibiotics and call in medication for moderate and severe pain.  Keep follow-up appointment as scheduled with dentist for next month. Toradol  injection given today. This is a medication to help with pain. This is not a narcotic. Do not take ibuprofen  today. Start 07/26/24 Ibuprofen  800 mg every 8 hours as needed for moderate pain. Hydrocodone /acetaminophen  5/325 mg 1-2 tablets every 6 hours as needed for severe pain. Use caution as this medication can cause sedation. Do not drive while taking this medication.  Do not take products containing Tylenol  while you are taking this medication. Clindamycin  300 mg 3 times daily for 7 days. Take this with food. This is an antibiotic.  Make sure to stay hydrated by drinking plenty of water. Keep follow-up appointment as scheduled with dentist Return to urgent care or PCP if symptoms worsen or fail to resolve.      ED Prescriptions      Medication Sig Dispense Auth. Provider   ibuprofen  (ADVIL ) 800 MG tablet Take 1 tablet (800 mg total) by mouth every 8 (eight) hours as needed for moderate pain (pain score 4-6) (pain). 21 tablet Isack Lavalley A, PA-C   clindamycin  (CLEOCIN ) 300 MG capsule Take 1 capsule (300 mg total) by mouth 3 (three) times daily for 7 days. 21 capsule Murry Khiev A, PA-C   HYDROcodone -acetaminophen  (NORCO/VICODIN) 5-325 MG tablet Take 1-2 tablets by mouth every 6 (six) hours as needed for severe pain (pain score 7-10). 10 tablet Teresa Almarie LABOR, [place]      I have reviewed the PDMP during this encounter.   Teresa Almarie LABOR, [place] 07/25/24 [phone]

## 2024-07-25 NOTE — ED Triage Notes (Signed)
 Pt states he has left lower side dental pain X 1 week. He is taking tylenol  as needed.

## 2024-07-25 NOTE — Discharge Instructions (Addendum)
 Left lower dental infection.  Will start antibiotics and call in medication for moderate and severe pain.  Keep follow-up appointment as scheduled with dentist for next month. Toradol  injection given today. This is a medication to help with pain. This is not a narcotic. Do not take ibuprofen  today. Start 07/26/24 Ibuprofen  800 mg every 8 hours as needed for moderate pain. Hydrocodone /acetaminophen  5/325 mg 1-2 tablets every 6 hours as needed for severe pain. Use caution as this medication can cause sedation. Do not drive while taking this medication.  Do not take products containing Tylenol  while you are taking this medication. Clindamycin  300 mg 3 times daily for 7 days. Take this with food. This is an antibiotic.  Make sure to stay hydrated by drinking plenty of water. Keep follow-up appointment as scheduled with dentist Return to urgent care or PCP if symptoms worsen or fail to resolve.
